# Patient Record
Sex: Female | Born: 1965 | Race: Black or African American | Hispanic: No | Marital: Single | Smoking: Never smoker
Health system: Southern US, Community
[De-identification: ages and names within clinical notes are randomized; demographics above are authoritative.]

## PROBLEM LIST (undated history)

## (undated) DIAGNOSIS — I10 Essential (primary) hypertension: Secondary | ICD-10-CM

## (undated) DIAGNOSIS — R7303 Prediabetes: Secondary | ICD-10-CM

## (undated) DIAGNOSIS — R011 Cardiac murmur, unspecified: Secondary | ICD-10-CM

## (undated) DIAGNOSIS — E785 Hyperlipidemia, unspecified: Secondary | ICD-10-CM

## (undated) HISTORY — DX: Prediabetes: R73.03

## (undated) HISTORY — DX: Essential (primary) hypertension: I10

## (undated) HISTORY — PX: WISDOM TOOTH EXTRACTION: SHX21

## (undated) HISTORY — PX: COLON SURGERY: SHX602

## (undated) HISTORY — DX: Hyperlipidemia, unspecified: E78.5

---

## 1998-03-17 ENCOUNTER — Encounter: Payer: Self-pay | Admitting: *Deleted

## 1998-03-17 ENCOUNTER — Ambulatory Visit (HOSPITAL_COMMUNITY): Admission: RE | Admit: 1998-03-17 | Discharge: 1998-03-17 | Payer: Self-pay | Admitting: *Deleted

## 1998-05-13 ENCOUNTER — Ambulatory Visit (HOSPITAL_COMMUNITY): Admission: RE | Admit: 1998-05-13 | Discharge: 1998-05-13 | Payer: Self-pay | Admitting: *Deleted

## 1998-05-13 ENCOUNTER — Encounter: Payer: Self-pay | Admitting: *Deleted

## 1999-07-06 ENCOUNTER — Encounter: Payer: Self-pay | Admitting: *Deleted

## 1999-07-06 ENCOUNTER — Ambulatory Visit (HOSPITAL_COMMUNITY): Admission: RE | Admit: 1999-07-06 | Discharge: 1999-07-06 | Payer: Self-pay | Admitting: *Deleted

## 2001-06-03 ENCOUNTER — Emergency Department (HOSPITAL_COMMUNITY): Admission: EM | Admit: 2001-06-03 | Discharge: 2001-06-03 | Payer: Self-pay | Admitting: Emergency Medicine

## 2003-02-04 ENCOUNTER — Emergency Department (HOSPITAL_COMMUNITY): Admission: EM | Admit: 2003-02-04 | Discharge: 2003-02-04 | Payer: Self-pay | Admitting: Family Medicine

## 2006-07-20 ENCOUNTER — Emergency Department (HOSPITAL_COMMUNITY): Admission: EM | Admit: 2006-07-20 | Discharge: 2006-07-20 | Payer: Self-pay | Admitting: Emergency Medicine

## 2007-11-26 ENCOUNTER — Emergency Department (HOSPITAL_COMMUNITY): Admission: EM | Admit: 2007-11-26 | Discharge: 2007-11-26 | Payer: Self-pay | Admitting: Family Medicine

## 2008-04-20 ENCOUNTER — Emergency Department (HOSPITAL_COMMUNITY): Admission: EM | Admit: 2008-04-20 | Discharge: 2008-04-20 | Payer: Self-pay | Admitting: Emergency Medicine

## 2008-05-28 ENCOUNTER — Emergency Department (HOSPITAL_COMMUNITY): Admission: EM | Admit: 2008-05-28 | Discharge: 2008-05-28 | Payer: Self-pay | Admitting: Family Medicine

## 2009-02-18 ENCOUNTER — Emergency Department (HOSPITAL_COMMUNITY): Admission: EM | Admit: 2009-02-18 | Discharge: 2009-02-18 | Payer: Self-pay | Admitting: Emergency Medicine

## 2011-01-21 ENCOUNTER — Emergency Department (HOSPITAL_COMMUNITY)
Admission: EM | Admit: 2011-01-21 | Discharge: 2011-01-21 | Disposition: A | Discharge status: Home / Self Care | Payer: PRIVATE HEALTH INSURANCE | Attending: Emergency Medicine | Admitting: Emergency Medicine

## 2011-01-21 ENCOUNTER — Encounter (HOSPITAL_COMMUNITY): Payer: Self-pay | Admitting: *Deleted

## 2011-01-21 ENCOUNTER — Emergency Department (HOSPITAL_COMMUNITY): Payer: PRIVATE HEALTH INSURANCE

## 2011-01-21 DIAGNOSIS — M25522 Pain in left elbow: Secondary | ICD-10-CM

## 2011-01-21 DIAGNOSIS — W010XXA Fall on same level from slipping, tripping and stumbling without subsequent striking against object, initial encounter: Secondary | ICD-10-CM | POA: Insufficient documentation

## 2011-01-21 DIAGNOSIS — M25529 Pain in unspecified elbow: Secondary | ICD-10-CM | POA: Insufficient documentation

## 2011-01-21 DIAGNOSIS — R011 Cardiac murmur, unspecified: Secondary | ICD-10-CM | POA: Insufficient documentation

## 2011-01-21 HISTORY — DX: Cardiac murmur, unspecified: R01.1

## 2011-01-21 MED ORDER — ACETAMINOPHEN 500 MG PO TABS
1000.0000 mg | ORAL_TABLET | Freq: Once | ORAL | Status: AC
  Administered 2011-01-21: 1000 mg via ORAL
  Filled 2011-01-21: qty 2

## 2011-01-21 NOTE — ED Provider Notes (Signed)
History     CSN: [id number]  Arrival date & time 01/21/11  7829   First MD Initiated Contact with Patient 01/21/11 [phone]      Chief Complaint  Patient presents with  . Fall  . Elbow Pain    (Consider location/radiation/quality/duration/timing/severity/associated sxs/prior treatment) HPI The patient presents 2 hours after a mechanical fall.  She slipped exiting her car, landing on her left arm.  Since the event.  She has had pain persistently about her left followup.  The pain is worse with motion,minimal improvement with ibuprofen.  Pain is described as dull.  No head contact, no LOC, no confusion, no ataxia, no other focal complaints Past Medical History  Diagnosis Date  . Heart murmur     Past Surgical History  Procedure Date  . Wisdom tooth extraction     Family History  Problem Relation Age of Onset  . Diabetes Mother   . Hypertension Mother   . Hypertension Father   . Cancer Father     History  Substance Use Topics  . Smoking status: Never Smoker   . Smokeless tobacco: Not on file  . Alcohol Use: No    OB History    Grav Para Term Preterm Abortions TAB SAB Ect Mult Living   1         1      Review of Systems  All other systems reviewed and are negative.    Allergies  Penicillins  Home Medications  No current outpatient prescriptions on file.  BP 145/84  Pulse 86  Temp(Src) 98.7 F (37.1 C) (Oral)  Resp 16  SpO2 99%  LMP 01/21/2011  Physical Exam  Nursing note and vitals reviewed. Constitutional: She is oriented to person, place, and time. She appears well-developed and well-nourished. No distress.  HENT:  Head: Normocephalic and atraumatic.  Mouth/Throat: Oropharynx is clear and moist.  Eyes: Conjunctivae are normal. Pupils are equal, round, and reactive to light. Right eye exhibits no discharge. Left eye exhibits no discharge.  Cardiovascular: Normal rate and intact distal pulses.   Murmur heard. Pulmonary/Chest: Effort normal and  breath sounds normal. No stridor. No respiratory distress.  Abdominal: She exhibits no distension.  Musculoskeletal:       Arms: Neurological: She is alert and oriented to person, place, and time. No cranial nerve deficit. She exhibits normal muscle tone. Coordination normal.  Skin: Skin is warm and dry. She is not diaphoretic.  Psychiatric: She has a normal mood and affect.    ED Course  Procedures (including critical care time)  Labs Reviewed - No data to display No results found.   No diagnosis found.   XR, reviewed by me - negative MDM  This generally well female presents following a mechanical fall with resultant left elbow pain.  On exam she is in no distress with minimal tenderness to palpation and no limitation to range of motion, nor any distal neurovascular deficits.  The patient's xrays were negative for fracture and she was discharged in stable condition.        Gerhard Munch, MD 01/21/11 [phone]

## 2011-01-21 NOTE — ED Notes (Signed)
md at bedside

## 2011-01-21 NOTE — ED Notes (Addendum)
Pt reports falling at work ([employer]) this am. Pt slipped and fell backwards landed on buttocks,back, and left elbow. Denies hitting head or loc. Full rom, can flex elbow with minor pain, can wiggle fingers, bilateral radial pulse +2. No obvious deformity noticed, slight small scratch on elbow. Pain 7/10 sharp continuous, radiates from elbow to shoulder.

## 2012-06-13 ENCOUNTER — Other Ambulatory Visit: Payer: Self-pay | Admitting: Family Medicine

## 2012-06-13 DIAGNOSIS — Z1231 Encounter for screening mammogram for malignant neoplasm of breast: Secondary | ICD-10-CM

## 2012-08-31 ENCOUNTER — Ambulatory Visit (HOSPITAL_BASED_OUTPATIENT_CLINIC_OR_DEPARTMENT_OTHER): Payer: 59 | Attending: Family Medicine

## 2012-08-31 VITALS — Ht 67.0 in | Wt 211.0 lb

## 2012-08-31 DIAGNOSIS — R0989 Other specified symptoms and signs involving the circulatory and respiratory systems: Secondary | ICD-10-CM | POA: Insufficient documentation

## 2012-08-31 DIAGNOSIS — G4733 Obstructive sleep apnea (adult) (pediatric): Secondary | ICD-10-CM

## 2012-08-31 DIAGNOSIS — G479 Sleep disorder, unspecified: Secondary | ICD-10-CM | POA: Insufficient documentation

## 2012-08-31 DIAGNOSIS — R0609 Other forms of dyspnea: Secondary | ICD-10-CM | POA: Insufficient documentation

## 2012-09-02 DIAGNOSIS — G4733 Obstructive sleep apnea (adult) (pediatric): Secondary | ICD-10-CM

## 2014-06-09 ENCOUNTER — Encounter (HOSPITAL_COMMUNITY): Payer: Self-pay | Admitting: Emergency Medicine

## 2014-06-09 ENCOUNTER — Emergency Department (HOSPITAL_COMMUNITY)
Admission: EM | Admit: 2014-06-09 | Discharge: 2014-06-09 | Disposition: A | Discharge status: Home / Self Care | Payer: Managed Care, Other (non HMO) | Source: Home / Self Care | Attending: Family Medicine | Admitting: Family Medicine

## 2014-06-09 DIAGNOSIS — J029 Acute pharyngitis, unspecified: Secondary | ICD-10-CM

## 2014-06-09 LAB — POCT RAPID STREP A: Streptococcus, Group A Screen (Direct): NEGATIVE

## 2014-06-09 MED ORDER — IPRATROPIUM BROMIDE 0.06 % NA SOLN: 2.0000 | Freq: Four times a day (QID) | NASAL | Status: DC

## 2014-06-09 NOTE — ED Provider Notes (Signed)
Gail Lloyd is a 49 y.o. female who presents to Urgent Care today for sore throat. Patient developed sore throat last night. This is associated with post nasal drip and fever. No vomiting diarrhea cough congestion or trouble breathing. She's used Tylenol which helps a lot.   Past Medical History  Diagnosis Date  . Heart murmur    Past Surgical History  Procedure Laterality Date  . Wisdom tooth extraction     History  Substance Use Topics  . Smoking status: Never Smoker   . Smokeless tobacco: Not on file  . Alcohol Use: No   ROS as above Medications: No current facility-administered medications for this encounter.   Current Outpatient Prescriptions  Medication Sig Dispense Refill  . acetaminophen (TYLENOL) 325 MG tablet Take 325 mg by mouth every 6 (six) hours as needed. For pain    . ibuprofen (ADVIL,MOTRIN) 200 MG tablet Take 200 mg by mouth every 6 (six) hours as needed. For pain    . ipratropium (ATROVENT) 0.06 % nasal spray Place 2 sprays into both nostrils 4 (four) times daily. 15 mL 1  . Multiple Vitamin (MULITIVITAMIN WITH MINERALS) TABS Take 1 tablet by mouth daily.     Allergies  Allergen Reactions  . Penicillins Hives and Itching  . Sudafed [Pseudoephedrine Hcl] Palpitations     Exam:  BP 143/70 mmHg  Pulse 120  Temp(Src) 100 F (37.8 C) (Oral)  Resp 18  SpO2 98%  LMP 06/08/2014 Gen: Well NAD HEENT: EOMI,  MMM clear nasal discharge. Posterior pharynx with cobblestoning. Normal tympanic membranes bilaterally. Lungs: Normal work of breathing. CTABL Heart: RRR no MRG Abd: NABS, Soft. Nondistended, Nontender Exts: Brisk capillary refill, warm and well perfused.   Results for orders placed or performed during the hospital encounter of 06/09/14 (from the past 24 hour(s))  POCT rapid strep A [facility] : [facility])     Status: None   Collection Time: 06/09/14  2:01 PM  Result Value Ref Range   Streptococcus, Group A Screen (Direct) NEGATIVE NEGATIVE   No results  found.  Assessment and Plan: 49 y.o. female with viral pharyngitis. Treatment with Tylenol and Atrovent nasal spray. Culture pending.  Discussed warning signs or symptoms. Please see discharge instructions. Patient expresses understanding.     Gregor Hams, MD 06/09/14 [phone]

## 2014-06-09 NOTE — ED Notes (Signed)
C/o ST associated w/PND and fevers since last night Alert, no signs of acute distress.

## 2014-06-09 NOTE — Discharge Instructions (Signed)
Thank you for coming in today. Call or go to the emergency room if you get worse, have trouble breathing, have chest pains, or palpitations.  Continue tylenol.  Use atrovent nasal spray.  Return as needed.   Pharyngitis Pharyngitis is redness, pain, and swelling (inflammation) of your pharynx.  CAUSES  Pharyngitis is usually caused by infection. Most of the time, these infections are from viruses (viral) and are part of a cold. However, sometimes pharyngitis is caused by bacteria (bacterial). Pharyngitis can also be caused by allergies. Viral pharyngitis may be spread from person to person by coughing, sneezing, and personal items or utensils (cups, forks, spoons, toothbrushes). Bacterial pharyngitis may be spread from person to person by more intimate contact, such as kissing.  SIGNS AND SYMPTOMS  Symptoms of pharyngitis include:   Sore throat.   Tiredness (fatigue).   Low-grade fever.   Headache.  Joint pain and muscle aches.  Skin rashes.  Swollen lymph nodes.  Plaque-like film on throat or tonsils (often seen with bacterial pharyngitis). DIAGNOSIS  Your health care provider will ask you questions about your illness and your symptoms. Your medical history, along with a physical exam, is often all that is needed to diagnose pharyngitis. Sometimes, a rapid strep test is done. Other lab tests may also be done, depending on the suspected cause.  TREATMENT  Viral pharyngitis will usually get better in 3-4 days without the use of medicine. Bacterial pharyngitis is treated with medicines that kill germs (antibiotics).  HOME CARE INSTRUCTIONS   Drink enough water and fluids to keep your urine clear or pale yellow.   Only take over-the-counter or prescription medicines as directed by your health care provider:   If you are prescribed antibiotics, make sure you finish them even if you start to feel better.   Do not take aspirin.   Get lots of rest.   Gargle with 8 oz of  salt water ( tsp of salt per 1 qt of water) as often as every 1-2 hours to soothe your throat.   Throat lozenges (if you are not at risk for choking) or sprays may be used to soothe your throat. SEEK MEDICAL CARE IF:   You have large, tender lumps in your neck.  You have a rash.  You cough up green, yellow-brown, or bloody spit. SEEK IMMEDIATE MEDICAL CARE IF:   Your neck becomes stiff.  You drool or are unable to swallow liquids.  You vomit or are unable to keep medicines or liquids down.  You have severe pain that does not go away with the use of recommended medicines.  You have trouble breathing (not caused by a stuffy nose). MAKE SURE YOU:   Understand these instructions.  Will watch your condition.  Will get help right away if you are not doing well or get worse. Document Released: 12/20/2004 Document Revised: 10/10/2012 Document Reviewed: 08/27/2012 [facility] Patient Information 2015 [place], [place]. This information is not intended to replace advice given to you by your health care provider. Make sure you discuss any questions you have with your health care provider.

## 2014-06-11 LAB — CULTURE, GROUP A STREP: Strep A Culture: NEGATIVE

## 2014-10-26 ENCOUNTER — Encounter (HOSPITAL_COMMUNITY): Payer: Self-pay

## 2014-10-26 ENCOUNTER — Emergency Department (HOSPITAL_COMMUNITY)
Admission: EM | Admit: 2014-10-26 | Discharge: 2014-10-26 | Disposition: A | Discharge status: Home / Self Care | Payer: Managed Care, Other (non HMO) | Attending: Emergency Medicine | Admitting: Emergency Medicine

## 2014-10-26 DIAGNOSIS — J302 Other seasonal allergic rhinitis: Secondary | ICD-10-CM | POA: Diagnosis not present

## 2014-10-26 DIAGNOSIS — Z880 Allergy status to penicillin: Secondary | ICD-10-CM | POA: Insufficient documentation

## 2014-10-26 DIAGNOSIS — R011 Cardiac murmur, unspecified: Secondary | ICD-10-CM | POA: Diagnosis not present

## 2014-10-26 DIAGNOSIS — J029 Acute pharyngitis, unspecified: Secondary | ICD-10-CM | POA: Diagnosis present

## 2014-10-26 DIAGNOSIS — Z9109 Other allergy status, other than to drugs and biological substances: Secondary | ICD-10-CM

## 2014-10-26 DIAGNOSIS — R591 Generalized enlarged lymph nodes: Secondary | ICD-10-CM

## 2014-10-26 LAB — RAPID STREP SCREEN (MED CTR MEBANE ONLY): STREPTOCOCCUS, GROUP A SCREEN (DIRECT): NEGATIVE

## 2014-10-26 MED ORDER — CETIRIZINE HCL 10 MG PO TABS: 10.0000 mg | ORAL_TABLET | Freq: Every day | ORAL | Status: DC

## 2014-10-26 NOTE — ED Provider Notes (Signed)
CSN: [id number]     Arrival date & time 10/26/14  2536 History   First MD Initiated Contact with Patient 10/26/14 [phone]     Chief Complaint  Patient presents with  . Sore Throat  . nasal drainage      (Consider location/radiation/quality/duration/timing/severity/associated sxs/prior Treatment) The history is provided by the patient and medical records. No language interpreter was used.     Gail Lloyd is a 49 y.o. female  with no major medical problems presents to the Emergency Department complaining of gradual, persistent, progressively worsening nasal drainage with associated sore throat onset 6 days ago after being outside all day. Patient reports associated postnasal drip, rhinorrhea, nasal congestion, itching and watering eyes, sneezing. She reports a long history of environmental allergies. She has taken Benadryl this week with improvement. She reports noting some "swelling" of her proximal neck 2 days ago which made her concerned. She denies difficulty swallowing or breathing. She denies dental pain or history of dental abscesses.  Nothing makes her symptoms worse.  Pt denies fever, chills, headache, neck pain, chest pain, shortness of breath, abdominal pain, nausea, vomiting, diarrhea, weakness, dizziness, syncope.  '  Past Medical History  Diagnosis Date  . Heart murmur    Past Surgical History  Procedure Laterality Date  . Wisdom tooth extraction     Family History  Problem Relation Age of Onset  . Diabetes Mother   . Hypertension Mother   . Hypertension Father   . Cancer Father    Social History  Substance Use Topics  . Smoking status: Never Smoker   . Smokeless tobacco: None  . Alcohol Use: No   OB History    Gravida Para Term Preterm AB TAB SAB Ectopic Multiple Living   1         1     Review of Systems  Constitutional: Negative for fever, diaphoresis, appetite change, fatigue and unexpected weight change.  HENT: Positive for congestion, ear pain (  resolved), facial swelling, postnasal drip, rhinorrhea, sneezing and sore throat. Negative for mouth sores.   Eyes: Positive for itching. Negative for visual disturbance.  Respiratory: Negative for cough, chest tightness, shortness of breath and wheezing.   Cardiovascular: Negative for chest pain.  Gastrointestinal: Negative for nausea, vomiting, abdominal pain, diarrhea and constipation.  Endocrine: Negative for polydipsia, polyphagia and polyuria.  Genitourinary: Negative for dysuria, urgency, frequency and hematuria.  Musculoskeletal: Negative for back pain and neck stiffness.  Skin: Negative for rash.  Allergic/Immunologic: Negative for immunocompromised state.  Neurological: Negative for syncope, light-headedness and headaches.  Hematological: Does not bruise/bleed easily.  Psychiatric/Behavioral: Negative for sleep disturbance. The patient is not nervous/anxious.       Allergies  Penicillins and Sudafed  Home Medications   Prior to Admission medications   Medication Sig Start Date End Date Taking? Authorizing Provider  diphenhydrAMINE (BENADRYL) 25 MG tablet Take 25 mg by mouth every 6 (six) hours as needed for allergies.   Yes Historical Provider, MD  cetirizine (ZYRTEC ALLERGY) 10 MG tablet Take 1 tablet (10 mg total) by mouth daily. 10/26/14   Nimisha Rathel, PA-C   BP 150/82 mmHg  Pulse 104  Temp(Src) 98.6 F (37 C) (Oral)  Resp 18  Ht 5\' 7"  (1.702 m)  Wt 207 lb (93.895 kg)  BMI 32.41 kg/m2  SpO2 100%  LMP 10/21/2014 (Exact Date) Physical Exam  Constitutional: She is oriented to person, place, and time. She appears well-developed and well-nourished. No distress.  HENT:  Head: Normocephalic  and atraumatic.  Right Ear: Tympanic membrane, external ear and ear canal normal.  Left Ear: Tympanic membrane, external ear and ear canal normal.  Nose: Mucosal edema and rhinorrhea present. No epistaxis. Right sinus exhibits no maxillary sinus tenderness and no frontal  sinus tenderness. Left sinus exhibits no maxillary sinus tenderness and no frontal sinus tenderness.  Mouth/Throat: Uvula is midline and mucous membranes are normal. Mucous membranes are not pale and not cyanotic. Posterior oropharyngeal erythema (minimal) present. No oropharyngeal exudate, posterior oropharyngeal edema or tonsillar abscesses.  No abnormal dentition Floor of the mouth is soft and nontender without woody induration Soft tissues of the mandible are pliable and nontender  Eyes: Conjunctivae are normal. Pupils are equal, round, and reactive to light.  Neck: Normal range of motion and full passive range of motion without pain.  Cardiovascular: Normal rate and intact distal pulses.   Pulmonary/Chest: Effort normal and breath sounds normal. No stridor.  Clear and equal breath sounds without focal wheezes, rhonchi, rales  Abdominal: Soft. Bowel sounds are normal. There is no tenderness.  Musculoskeletal: Normal range of motion.  Lymphadenopathy:       Head (right side): Submandibular and tonsillar adenopathy present.       Head (left side): Submandibular and tonsillar adenopathy present.    She has cervical adenopathy.       Right cervical: Superficial cervical adenopathy present.       Left cervical: Superficial cervical adenopathy present.  Lymphadenopathy is discrete, mobile and slightly tender bilaterally No matted lymph nodes  Neurological: She is alert and oriented to person, place, and time.  Skin: Skin is warm and dry. No rash noted. She is not diaphoretic.  Psychiatric: She has a normal mood and affect.  Nursing note and vitals reviewed.   ED Course  Procedures (including critical care time) Labs Review Labs Reviewed  RAPID STREP SCREEN (NOT AT [facility])    Imaging Review No results found. I have personally reviewed and evaluated these images and lab results as part of my medical decision-making.   EKG Interpretation None      MDM   Final diagnoses:   Environmental allergies  Lymphadenopathy of head and neck   Vickie Epley presents with history of environmental allergies and history and physical consistent with same today. Mild lymphadenopathy of the tonsils and submandibular region.  No evidence of Ludwig angina.  Lymphadenopathy is likely reactive. Will treat with symptomatically or PE and have her follow-up with her primary care physician in 2 days for recheck of her lymphadenopathy.  BP 150/82 mmHg  Pulse 104  Temp(Src) 98.6 F (37 C) (Oral)  Resp 18  Ht 5\' 7"  (1.702 m)  Wt 207 lb (93.895 kg)  BMI 32.41 kg/m2  SpO2 100%  LMP 10/21/2014 (Exact Date)    Abigail Butts, PA-C 10/26/14 0165  Everlene Balls, MD 10/26/14 [phone]

## 2014-10-26 NOTE — Discharge Instructions (Signed)
1. Medications: Zyrtec, flonase (at home), usual home medications 2. Treatment: rest, drink plenty of fluids, take medications as prescribed 3. Follow Up: Please followup with your primary doctor in 3 days for discussion of your diagnoses and further evaluation after today's visit; if you do not have a primary care doctor use the resource guide provided to find one; Return to the ER for difficulty breathing, difficulty swallowing, worsening pain, high fevers or other concerning symptoms    Hay Fever Hay fever is an allergic reaction to particles in the air. It cannot be passed from person to person. It cannot be cured, but it can be controlled. CAUSES  Hay fever is caused by something that triggers an allergic reaction (allergens). The following are examples of allergens:  Ragweed.  Feathers.  Animal dander.  Grass and tree pollens.  Cigarette smoke.  House dust.  Pollution. SYMPTOMS   Sneezing.  Runny or stuffy nose.  Tearing eyes.  Itchy eyes, nose, mouth, throat, skin, or other area.  Sore throat.  Headache.  Decreased sense of smell or taste. DIAGNOSIS Your caregiver will perform a physical exam and ask questions about the symptoms you are having.Allergy testing may be done to determine exactly what triggers your hay fever.  TREATMENT   Over-the-counter medicines may help symptoms. These include:  Antihistamines.  Decongestants. These may help with nasal congestion.  Your caregiver may prescribe medicines if over-the-counter medicines do not work.  Some people benefit from allergy shots when other medicines are not helpful. HOME CARE INSTRUCTIONS   Avoid the allergen that is causing your symptoms, if possible.  Take all medicine as told by your caregiver. SEEK MEDICAL CARE IF:   You have severe allergy symptoms and your current medicines are not helping.  Your treatment was working at one time, but you are now experiencing symptoms.  You have sinus  congestion and pressure.  You develop a fever or headache.  You have thick nasal discharge.  You have asthma and have a worsening cough and wheezing. SEEK IMMEDIATE MEDICAL CARE IF:   You have swelling of your tongue or lips.  You have trouble breathing.  You feel lightheaded or like you are going to faint.  You have cold sweats.  You have a fever.   This information is not intended to replace advice given to you by your health care provider. Make sure you discuss any questions you have with your health care provider.   Document Released: 12/20/2004 Document Revised: 03/14/2011 Document Reviewed: 07/02/2014 Elsevier Interactive Patient Education [employer].

## 2014-10-26 NOTE — ED Notes (Signed)
Pt states that she began having nasal drainage and sore throat. Airway in tact. Pt denies cough.

## 2014-10-28 LAB — CULTURE, GROUP A STREP: STREP A CULTURE: NEGATIVE

## 2016-01-05 ENCOUNTER — Ambulatory Visit (INDEPENDENT_AMBULATORY_CARE_PROVIDER_SITE_OTHER): Payer: Managed Care, Other (non HMO) | Admitting: Physician Assistant

## 2016-01-05 ENCOUNTER — Encounter: Payer: Self-pay | Admitting: Physician Assistant

## 2016-01-05 VITALS — BP 140/84 | HR 105 | Temp 99.1°F | Ht 67.0 in | Wt 205.2 lb

## 2016-01-05 DIAGNOSIS — J0140 Acute pansinusitis, unspecified: Secondary | ICD-10-CM

## 2016-01-05 MED ORDER — GUAIFENESIN ER 1200 MG PO TB12: 1.0000 | ORAL_TABLET | Freq: Two times a day (BID) | ORAL | 1 refills | Status: DC | PRN

## 2016-01-05 MED ORDER — DOXYCYCLINE HYCLATE 100 MG PO CAPS: 100.0000 mg | ORAL_CAPSULE | Freq: Two times a day (BID) | ORAL | 0 refills | Status: AC

## 2016-01-05 MED FILL — DOXYCYCLINE HYC 100 MG CAP: 100 | 7 days supply | Qty: 14 | Fill #0

## 2016-01-05 NOTE — Progress Notes (Signed)
[facility] [address], [place] [address] [phone]  Date:  01/05/2016   Name:  Gail Lloyd   DOB:  1965-04-25   MRN:  [id number]  PCP:  Gail Blackbird, MD    History of Present Illness:  Gail Lloyd is a 51 y.o. female patient who presents to [facility] for cc of sinus and congestion. Couple weeks ago of drainage.  2 days ago, woke up with cough, congestion, subjective fever and chills.  She rested.  Nighttime up more with cough.  Sinus pressure around eyes, and maxillary.  No rhinorrhea.  She is coughing it up with post nasal drip.  She feels congestion.  No sob or dyspnea.  ipratroprium nasal spray has helped. She is not taking the zyrtec.  She has been with sick contacts.   --hx of allergies   There are no active problems to display for this patient.   Past Medical History:  Diagnosis Date  . Heart murmur     Past Surgical History:  Procedure Laterality Date  . WISDOM TOOTH EXTRACTION      Social History  Substance Use Topics  . Smoking status: Never Smoker  . Smokeless tobacco: Never Used  . Alcohol use No    Family History  Problem Relation Age of Onset  . Diabetes Mother   . Hypertension Mother   . Hypertension Father   . Cancer Father     Allergies  Allergen Reactions  . Penicillins Hives and Itching  . Sudafed [Pseudoephedrine Hcl] Palpitations    Medication list has been reviewed and updated.  Current Outpatient Prescriptions on File Prior to Visit  Medication Sig Dispense Refill  . diphenhydrAMINE (BENADRYL) 25 MG tablet Take 25 mg by mouth every 6 (six) hours as needed for allergies.    . cetirizine (ZYRTEC ALLERGY) 10 MG tablet Take 1 tablet (10 mg total) by mouth daily. (Patient not taking: Reported on 01/05/2016) 30 tablet 1  . [DISCONTINUED] ipratropium (ATROVENT) 0.06 % nasal spray Place 2 sprays into both nostrils 4 (four) times daily. (Patient not taking: Reported on 10/26/2014) 15 mL 1   No current  facility-administered medications on file prior to visit.     ROS ROS otherwise unremarkable unless listed above.   Physical Examination: BP 140/84 (BP Location: Right Arm, Patient Position: Sitting, Cuff Size: Small)   Pulse (!) 105   Temp 99.1 F (37.3 C) (Oral)   Ht 5\' 7"  (1.702 m)   Wt 205 lb 3.2 oz (93.1 kg)   LMP 12/26/2015 (Exact Date)   SpO2 98%   BMI 32.14 kg/m  Ideal Body Weight: Weight in (lb) to have BMI = 25: 159.3  Physical Exam  Constitutional: She is oriented to person, place, and time. She appears well-developed and well-nourished. No distress.  HENT:  Head: Normocephalic and atraumatic.  Right Ear: Tympanic membrane, external ear and ear canal normal.  Left Ear: Tympanic membrane, external ear and ear canal normal.  Nose: Mucosal edema and rhinorrhea present. Right sinus exhibits no maxillary sinus tenderness and no frontal sinus tenderness. Left sinus exhibits no maxillary sinus tenderness and no frontal sinus tenderness.  Mouth/Throat: No uvula swelling. No oropharyngeal exudate, posterior oropharyngeal edema or posterior oropharyngeal erythema.  Eyes: Conjunctivae and EOM are normal. Pupils are equal, round, and reactive to light.  Cardiovascular: Normal rate and regular rhythm.  Exam reveals no gallop, no distant heart sounds and no friction rub.   No murmur heard. Pulmonary/Chest: Effort normal.  No respiratory distress. She has no decreased breath sounds. She has no wheezes. She has no rhonchi.  Lymphadenopathy:       Head (right side): No submandibular, no tonsillar, no preauricular and no posterior auricular adenopathy present.       Head (left side): No submandibular, no tonsillar, no preauricular and no posterior auricular adenopathy present.  Neurological: She is alert and oriented to person, place, and time.  Skin: She is not diaphoretic.  Psychiatric: She has a normal mood and affect. Her behavior is normal.     Assessment and Plan: [name] is a 51 y.o. female who is here today for cc of sinus issues, and congestion. Will treat for possible double sickening with bacterial etiology.    Subacute pansinusitis - Plan: doxycycline (VIBRAMYCIN) 100 MG capsule  Gail Drape, PA-C [facility] 1/7/20185:33 PM

## 2016-01-05 NOTE — Patient Instructions (Addendum)
Sinusitis, Adult Sinusitis is soreness and inflammation of your sinuses. Sinuses are hollow spaces in the bones around your face. They are located:  Around your eyes.  In the middle of your forehead.  Behind your nose.  In your cheekbones. Your sinuses and nasal passages are lined with a stringy fluid (mucus). Mucus normally drains out of your sinuses. When your nasal tissues get inflamed or swollen, the mucus can get trapped or blocked so air cannot flow through your sinuses. This lets bacteria, viruses, and funguses grow, and that leads to infection. Follow these instructions at home: Medicines   Take, use, or apply over-the-counter and prescription medicines only as told by your doctor. These may include nasal sprays.  If you were prescribed an antibiotic medicine, take it as told by your doctor. Do not stop taking the antibiotic even if you start to feel better. Hydrate and Humidify   Drink enough water to keep your pee (urine) clear or pale yellow.  Use a cool mist humidifier to keep the humidity level in your home above 50%.  Breathe in steam for 10-15 minutes, 3-4 times a day or as told by your doctor. You can do this in the bathroom while a hot shower is running.  Try not to spend time in cool or dry air. Rest   Rest as much as possible.  Sleep with your head raised (elevated).  Make sure to get enough sleep each night. General instructions   Put a warm, moist washcloth on your face 3-4 times a day or as told by your doctor. This will help with discomfort.  Wash your hands often with soap and water. If there is no soap and water, use hand sanitizer.  Do not smoke. Avoid being around people who are smoking (secondhand smoke).  Keep all follow-up visits as told by your doctor. This is important. Contact a doctor if:  You have a fever.  Your symptoms get worse.  Your symptoms do not get better within 10 days. Get help right away if:  You have a very bad  headache.  You cannot stop throwing up (vomiting).  You have pain or swelling around your face or eyes.  You have trouble seeing.  You feel confused.  Your neck is stiff.  You have trouble breathing. This information is not intended to replace advice given to you by your health care provider. Make sure you discuss any questions you have with your health care provider. Document Released: 06/08/2007 Document Revised: 08/16/2015 Document Reviewed: 10/15/2014 Elsevier Interactive Patient Education  2017 Elsevier Inc.     IF you received an x-ray today, you will receive an invoice from [facility]. Please contact [facility] at [phone] with questions or concerns regarding your invoice.   IF you received labwork today, you will receive an invoice from [facility]. Please contact [facility] at [phone] with questions or concerns regarding your invoice.   Our billing staff will not be able to assist you with questions regarding bills from these companies.  You will be contacted with the lab results as soon as they are available. The fastest way to get your results is to activate your My Chart account. Instructions are located on the last page of this paperwork. If you have not heard from us regarding the results in 2 weeks, please contact this office.     

## 2016-01-07 ENCOUNTER — Other Ambulatory Visit: Payer: Self-pay | Admitting: Physician Assistant

## 2016-01-07 ENCOUNTER — Telehealth: Payer: Self-pay

## 2016-01-07 MED ORDER — HYDROCOD POLST-CPM POLST ER 10-8 MG/5ML PO SUER: 5.0000 mL | Freq: Every evening | ORAL | 0 refills | Status: DC | PRN

## 2016-01-07 NOTE — Telephone Encounter (Signed)
Would you rx a cough suppresant for this patient so she can sleep?

## 2016-01-07 NOTE — Telephone Encounter (Signed)
Patient advised up front for pick up

## 2016-01-07 NOTE — Telephone Encounter (Signed)
Prescription filled.  She will need to pick it up.

## 2016-01-07 NOTE — Telephone Encounter (Signed)
Pt was seen on 01/05/15 for Subacute pansinusitis by Charlsie Quest English. She was prescribed Guaifenesin (MUCINEX MAXIMUM STRENGTH) 1200 MG TB12. She feels this script is not working, she is still coughing and is not getting any sleep. She would like to be prescribed something else. Pharmacy:  [place], [place] - [address] [facility].. Please advise at [phone]

## 2017-02-23 ENCOUNTER — Ambulatory Visit (HOSPITAL_COMMUNITY)
Admission: EM | Admit: 2017-02-23 | Discharge: 2017-02-23 | Disposition: A | Discharge status: Home / Self Care | Payer: 59 | Attending: Family Medicine | Admitting: Family Medicine

## 2017-02-23 ENCOUNTER — Encounter (HOSPITAL_COMMUNITY): Payer: Self-pay | Admitting: Emergency Medicine

## 2017-02-23 ENCOUNTER — Ambulatory Visit (INDEPENDENT_AMBULATORY_CARE_PROVIDER_SITE_OTHER): Payer: 59

## 2017-02-23 DIAGNOSIS — J0140 Acute pansinusitis, unspecified: Secondary | ICD-10-CM

## 2017-02-23 DIAGNOSIS — R05 Cough: Secondary | ICD-10-CM | POA: Diagnosis not present

## 2017-02-23 DIAGNOSIS — R0602 Shortness of breath: Secondary | ICD-10-CM

## 2017-02-23 MED ORDER — FLUTICASONE PROPIONATE 50 MCG/ACT NA SUSP: 2.0000 | Freq: Every day | NASAL | 0 refills | Status: DC

## 2017-02-23 MED ORDER — IPRATROPIUM BROMIDE 0.06 % NA SOLN: 2.0000 | Freq: Four times a day (QID) | NASAL | 0 refills | Status: DC

## 2017-02-23 MED ORDER — DOXYCYCLINE HYCLATE 100 MG PO CAPS: 100.0000 mg | ORAL_CAPSULE | Freq: Two times a day (BID) | ORAL | 0 refills | Status: DC

## 2017-02-23 NOTE — ED Triage Notes (Signed)
PT reports cough, sinus drainage for over 1 week. PT has been using claritin without relief.

## 2017-02-23 NOTE — ED Provider Notes (Signed)
[facility]    CSN: [id number] Arrival date & time: 02/23/17  1055     History   Chief Complaint Chief Complaint  Patient presents with  . URI    HPI Gail Lloyd is a 52 y.o. female.   52 year old female comes in for 1-2-week history of URI symptoms. Productive cough, sinus drainage, rhinorrhea, throat irritation. Now with back and chest soreness during cough. Denies fever, chills, night sweats. States some shortness of breath during cough with some wheezing. Has tried Claritin without relief. Never smoker.        Past Medical History:  Diagnosis Date  . Heart murmur     There are no active problems to display for this patient.   Past Surgical History:  Procedure Laterality Date  . WISDOM TOOTH EXTRACTION      OB History    Gravida Para Term Preterm AB Living   1         1   SAB TAB Ectopic Multiple Live Births                   Home Medications    Prior to Admission medications   Medication Sig Start Date End Date Taking? Authorizing Provider  diphenhydrAMINE (BENADRYL) 25 MG tablet Take 25 mg by mouth every 6 (six) hours as needed for allergies.   Yes [provider]  chlorpheniramine-HYDROcodone (TUSSIONEX PENNKINETIC ER) 10-8 MG/5ML SUER Take 5 mLs by mouth at bedtime as needed. 01/07/16   Ivar Drape D, PA  doxycycline (VIBRAMYCIN) 100 MG capsule Take 1 capsule (100 mg total) by mouth 2 (two) times daily. 02/23/17   Tasia Catchings, Cecilie Heidel V, PA-C  fluticasone (FLONASE) 50 MCG/ACT nasal spray Place 2 sprays into both nostrils daily. 02/23/17   Tasia Catchings, Yareth Kearse V, PA-C  Guaifenesin (MUCINEX MAXIMUM STRENGTH) 1200 MG TB12 Take 1 tablet (1,200 mg total) by mouth every 12 (twelve) hours as needed. 01/05/16   Ivar Drape D, PA  ipratropium (ATROVENT) 0.06 % nasal spray Place 2 sprays into both nostrils 4 (four) times daily. 02/23/17   Ok Edwards, PA-C    Family History Family History  Problem Relation Age of Onset  . Diabetes Mother   . Hypertension  Mother   . Hypertension Father   . Cancer Father     Social History Social History   Tobacco Use  . Smoking status: Never Smoker  . Smokeless tobacco: Never Used  Substance Use Topics  . Alcohol use: No  . Drug use: No     Allergies   Penicillins and Sudafed [pseudoephedrine hcl]   Review of Systems Review of Systems  Reason unable to perform ROS: See HPI as above.     Physical Exam Triage Vital Signs ED Triage Vitals  Enc Vitals Group     BP 02/23/17 1109 (!) 150/88     Pulse Rate 02/23/17 1109 94     Resp 02/23/17 1109 16     Temp 02/23/17 1109 99.7 F (37.6 C)     Temp Source 02/23/17 1109 Oral     SpO2 02/23/17 1109 99 %     Weight 02/23/17 1108 207 lb (93.9 kg)     Height --      Head Circumference --      Peak Flow --      Pain Score 02/23/17 1108 0     Pain Loc --      Pain Edu? --      Excl. in [place]? --  No data found.  Updated Vital Signs BP (!) 150/88   Pulse 94   Temp 99.7 F (37.6 C) (Oral)   Resp 16   Wt 207 lb (93.9 kg)   LMP 01/31/2017   SpO2 99%   BMI 32.42 kg/m   Physical Exam  Constitutional: She is oriented to person, place, and time. She appears well-developed and well-nourished. No distress.  HENT:  Head: Normocephalic and atraumatic.  Right Ear: Tympanic membrane, external ear and ear canal normal. Tympanic membrane is not erythematous and not bulging.  Left Ear: Tympanic membrane, external ear and ear canal normal. Tympanic membrane is not erythematous and not bulging.  Nose: Mucosal edema and rhinorrhea present. Right sinus exhibits maxillary sinus tenderness and frontal sinus tenderness. Left sinus exhibits maxillary sinus tenderness and frontal sinus tenderness.  Mouth/Throat: Uvula is midline, oropharynx is clear and moist and mucous membranes are normal.  Eyes: Conjunctivae are normal. Pupils are equal, round, and reactive to light.  Neck: Normal range of motion. Neck supple.  Cardiovascular: Normal rate, regular  rhythm and normal heart sounds. Exam reveals no gallop and no friction rub.  No murmur heard. Pulmonary/Chest: Effort normal and breath sounds normal. She has no decreased breath sounds. She has no wheezes. She has no rhonchi. She has no rales.  Lymphadenopathy:    She has no cervical adenopathy.  Neurological: She is alert and oriented to person, place, and time.  Skin: Skin is warm and dry.  Psychiatric: She has a normal mood and affect. Her behavior is normal. Judgment normal.     UC Treatments / Results  Labs (all labs ordered are listed, but only abnormal results are displayed) Labs Reviewed - No data to display  EKG  EKG Interpretation None       Radiology Dg Chest 2 View  Result Date: 02/23/2017 CLINICAL DATA:  Productive cough, low-grade fever, shortness of Breath EXAM: CHEST  2 VIEW COMPARISON:  None FINDINGS: Heart and mediastinal contours are within normal limits. No focal opacities or effusions. No acute bony abnormality. IMPRESSION: No active cardiopulmonary disease. Electronically Signed   By: Rolm Baptise M.D.   On: 02/23/2017 12:07    Procedures Procedures (including critical care time)  Medications Ordered in UC Medications - No data to display   Initial Impression / Assessment and Plan / UC Course  I have reviewed the triage vital signs and the nursing notes.  Pertinent labs & imaging results that were available during my care of the patient were reviewed by me and considered in my medical decision making (see chart for details).    CXR without pneumonia. Will treat for sinusitis with doxycycline. Other symptomatic treatment discussed. Push fluids. Return precautions given.   Final Clinical Impressions(s) / UC Diagnoses   Final diagnoses:  Acute non-recurrent pansinusitis    ED Discharge Orders        Ordered    doxycycline (VIBRAMYCIN) 100 MG capsule  2 times daily     02/23/17 1218    fluticasone (FLONASE) 50 MCG/ACT nasal spray  Daily      02/23/17 1219    ipratropium (ATROVENT) 0.06 % nasal spray  4 times daily     02/23/17 1219        Ok Edwards, PA-C 02/23/17 1221

## 2017-02-23 NOTE — Discharge Instructions (Signed)
Chest xray negative for pneumonia. Start doxycycline for sinus infection. You can also use flonase, atrovent nasal spray to help with sinus pressure, nasal congestion/drainage. You can use over the counter nasal saline rinse such as neti pot for nasal congestion. Keep hydrated, your urine should be clear to pale yellow in color. Tylenol/motrin for fever and pain. Monitor for any worsening of symptoms, chest pain, shortness of breath, wheezing, swelling of the throat, follow up for reevaluation.   For sore throat try using a honey-based tea. Use 3 teaspoons of honey with juice squeezed from half lemon. Place shaved pieces of ginger into 1/2-1 cup of water and warm over stove top. Then mix the ingredients and repeat every 4 hours as needed.

## 2017-05-09 ENCOUNTER — Other Ambulatory Visit: Payer: Self-pay

## 2017-05-09 DIAGNOSIS — Z1231 Encounter for screening mammogram for malignant neoplasm of breast: Secondary | ICD-10-CM

## 2018-08-02 ENCOUNTER — Telehealth: Payer: Self-pay | Admitting: Family Medicine

## 2018-08-02 NOTE — Telephone Encounter (Signed)
Patient is calling to schedule a new patient appointment with Dr. Volanda Napoleon. However is also interested in getting COVID testing. Patient is interested in getting tested tomorrow if possible. Please advise CB- [phone]

## 2018-08-03 NOTE — Telephone Encounter (Signed)
Pt scheduled for NP appt with [facility]. Advised if she gets COVID testing done and is positive we need to know and her appt will need to be rescheduled. Pt voiced understanding.

## 2018-08-09 ENCOUNTER — Ambulatory Visit (INDEPENDENT_AMBULATORY_CARE_PROVIDER_SITE_OTHER): Payer: 59 | Admitting: Family Medicine

## 2018-08-09 DIAGNOSIS — R030 Elevated blood-pressure reading, without diagnosis of hypertension: Secondary | ICD-10-CM

## 2018-08-09 DIAGNOSIS — U071 COVID-19: Secondary | ICD-10-CM | POA: Diagnosis not present

## 2018-08-09 NOTE — Progress Notes (Signed)
Virtual Visit via Video Note  I connected with Gail Lloyd on 08/09/18 at  3:00 PM EDT by a video enabled telemedicine application 2/2 [id number] pandemic and verified that I am speaking with the correct person using two identifiers.  Location patient: home Location provider:work or home office Persons participating in the virtual visit: patient, provider  I discussed the limitations of evaluation and management by telemedicine and the availability of in person appointments. The patient expressed understanding and agreed to proceed.   HPI:  Pt is a 53 yo female with pmh sig for heart murmur.  Pt previously seen by Dr. Antony Lloyd.  COVID positive: -felt flush last Tuesday at work, temp was normal -Got tested Friday at [employer], given positive result on Sunday -VS at time of visit Friday 160/81, P 112, pO2 98%, temp 100.9 -Endorsed nasal drainage, low grade temp to Tmax 102, ear pressure -denies cough, SOB, sneezing, sore throat, -alternating Tylenol and advil. -feeling better. -unsure of sick contacts.  States her uncle started feeling sick 3 wks ago, but wasn't tested when she took him to the hospital. He has recently tested positive and is "sick".  Elevated BP: -no prior h/o HTN -not on meds -bp 160/81 during UC visit last wk. -pt denies changes in vision, CP  Social hx: Pt works at [employer].     ROS: See pertinent positives and negatives per HPI.  Past Medical History:  Diagnosis Date  . Heart murmur     Past Surgical History:  Procedure Laterality Date  . WISDOM TOOTH EXTRACTION      Family History  Problem Relation Age of Onset  . Diabetes Mother   . Hypertension Mother   . Hypertension Father   . Cancer Father      Current Outpatient Medications:  .  chlorpheniramine-HYDROcodone (TUSSIONEX PENNKINETIC ER) 10-8 MG/5ML SUER, Take 5 mLs by mouth at bedtime as needed., Disp: 60 mL, Rfl: 0 .  diphenhydrAMINE (BENADRYL) 25 MG tablet, Take 25 mg by mouth  every 6 (six) hours as needed for allergies., Disp: , Rfl:  .  doxycycline (VIBRAMYCIN) 100 MG capsule, Take 1 capsule (100 mg total) by mouth 2 (two) times daily., Disp: 20 capsule, Rfl: 0 .  fluticasone (FLONASE) 50 MCG/ACT nasal spray, Place 2 sprays into both nostrils daily., Disp: 1 g, Rfl: 0 .  Guaifenesin (MUCINEX MAXIMUM STRENGTH) 1200 MG TB12, Take 1 tablet (1,200 mg total) by mouth every 12 (twelve) hours as needed., Disp: 14 tablet, Rfl: 1 .  ipratropium (ATROVENT) 0.06 % nasal spray, Place 2 sprays into both nostrils 4 (four) times daily., Disp: 15 mL, Rfl: 0  EXAM:  VITALS per patient if applicable: Temp 64.3 F, RR between 12-20 bpm  GENERAL: alert, oriented, appears well and in no acute distress  HEENT: atraumatic, conjunctiva clear, no obvious abnormalities on inspection of external nose and ears  NECK: normal movements of the head and neck  LUNGS: on inspection no signs of respiratory distress, breathing rate appears normal, no obvious gross SOB, gasping or wheezing  CV: no obvious cyanosis  MS: moves all visible extremities without noticeable abnormality  PSYCH/NEURO: pleasant and cooperative, no obvious depression or anxiety, speech and thought processing grossly intact  ASSESSMENT AND PLAN:  Discussed the following assessment and plan:  COVID-19 virus infection  -supportive care -given precautions -continue self quarantine -if needed will provide note for work  Elevated blood-pressure reading without diagnosis of hypertension  -likely elevated 2/2 ongoing illness -discussed lifestyle modifications when  recovered from COVID infection -pt to monitor bp at home and keep a log -if bp continues to remain elevated after recovery from [employer] will discuss starting bp medication  F/u in 1-2 wks    I discussed the assessment and treatment plan with the patient. The patient was provided an opportunity to ask questions and all were answered. The patient agreed with the  plan and demonstrated an understanding of the instructions.   The patient was advised to call back or seek an in-person evaluation if the symptoms worsen or if the condition fails to improve as anticipated.  Billie Ruddy, MD

## 2018-08-10 ENCOUNTER — Encounter: Payer: Self-pay | Admitting: Family Medicine

## 2018-09-21 ENCOUNTER — Other Ambulatory Visit: Payer: Self-pay | Admitting: Family Medicine

## 2018-09-21 DIAGNOSIS — Z1231 Encounter for screening mammogram for malignant neoplasm of breast: Secondary | ICD-10-CM

## 2018-10-05 ENCOUNTER — Ambulatory Visit
Admission: RE | Admit: 2018-10-05 | Discharge: 2018-10-05 | Disposition: A | Discharge status: Home / Self Care | Payer: 59 | Source: Ambulatory Visit | Attending: Family Medicine | Admitting: Family Medicine

## 2018-10-05 ENCOUNTER — Other Ambulatory Visit: Payer: Self-pay

## 2018-10-05 DIAGNOSIS — Z1231 Encounter for screening mammogram for malignant neoplasm of breast: Secondary | ICD-10-CM

## 2018-10-08 LAB — HM MAMMOGRAPHY

## 2018-10-09 ENCOUNTER — Other Ambulatory Visit: Payer: Self-pay | Admitting: Family Medicine

## 2018-10-09 DIAGNOSIS — R928 Other abnormal and inconclusive findings on diagnostic imaging of breast: Secondary | ICD-10-CM

## 2018-10-10 ENCOUNTER — Encounter: Payer: Self-pay | Admitting: Family Medicine

## 2018-10-11 ENCOUNTER — Ambulatory Visit
Admission: RE | Admit: 2018-10-11 | Discharge: 2018-10-11 | Disposition: A | Discharge status: Home / Self Care | Payer: 59 | Source: Ambulatory Visit | Attending: Family Medicine | Admitting: Family Medicine

## 2018-10-11 ENCOUNTER — Other Ambulatory Visit: Payer: Self-pay | Admitting: Family Medicine

## 2018-10-11 ENCOUNTER — Other Ambulatory Visit: Payer: Self-pay

## 2018-10-11 DIAGNOSIS — R928 Other abnormal and inconclusive findings on diagnostic imaging of breast: Secondary | ICD-10-CM

## 2018-10-11 DIAGNOSIS — N63 Unspecified lump in unspecified breast: Secondary | ICD-10-CM

## 2018-10-11 LAB — HM MAMMOGRAPHY

## 2018-10-24 ENCOUNTER — Encounter: Payer: Self-pay | Admitting: Family Medicine

## 2018-11-06 ENCOUNTER — Ambulatory Visit: Payer: 59

## 2018-11-06 ENCOUNTER — Encounter: Payer: Self-pay | Admitting: Family Medicine

## 2019-01-30 ENCOUNTER — Telehealth (INDEPENDENT_AMBULATORY_CARE_PROVIDER_SITE_OTHER): Payer: Managed Care, Other (non HMO) | Admitting: Family Medicine

## 2019-01-30 DIAGNOSIS — I10 Essential (primary) hypertension: Secondary | ICD-10-CM

## 2019-01-30 MED ORDER — AMLODIPINE BESYLATE 5 MG PO TABS: 5.0000 mg | ORAL_TABLET | Freq: Every day | ORAL | 3 refills | Status: DC

## 2019-01-30 NOTE — Progress Notes (Signed)
This visit type was conducted due to national recommendations for restrictions regarding the COVID-19 pandemic in an effort to limit this patient's exposure and mitigate transmission in our community.   Virtual Visit via Video Note  I connected with Gail Lloyd on 01/30/19 at  5:15 PM EST by a video enabled telemedicine application and verified that I am speaking with the correct person using two identifiers.  Location patient: home Location provider:work or home office Persons participating in the virtual visit: patient, provider  I discussed the limitations of evaluation and management by telemedicine and the availability of in person appointments. The patient expressed understanding and agreed to proceed.   HPI: [facility] had called with concerns for elevated blood pressures.  She states that she has actually had some borderline elevations for past couple years.  She went to her dentist earlier today and had reading 180/108.  She generally feels fine.  She has not had any recent dizziness, chest pains, headaches.  She has been monitoring blood pressure at home and has had readings generally [id number] systolic and around 90 diastolic.  Very strong family history of hypertension and mother, brother, and sister.  No nonsteroidal use.  No alcohol use.  No regular exercise.  Tries to watch her sodium intake.  Never treated for hypertension previously.   ROS: See pertinent positives and negatives per HPI.  Past Medical History:  Diagnosis Date  . Heart murmur     Past Surgical History:  Procedure Laterality Date  . WISDOM TOOTH EXTRACTION      Family History  Problem Relation Age of Onset  . Diabetes Mother   . Hypertension Mother   . Hypertension Father   . Cancer Father     SOCIAL HX: Non-smoker and no alcohol use   Current Outpatient Medications:  .  amLODipine (NORVASC) 5 MG tablet, Take 1 tablet (5 mg total) by mouth daily., Disp: 90 tablet, Rfl: 3 .  chlorpheniramine-HYDROcodone  (TUSSIONEX PENNKINETIC ER) 10-8 MG/5ML SUER, Take 5 mLs by mouth at bedtime as needed., Disp: 60 mL, Rfl: 0 .  diphenhydrAMINE (BENADRYL) 25 MG tablet, Take 25 mg by mouth every 6 (six) hours as needed for allergies., Disp: , Rfl:  .  doxycycline (VIBRAMYCIN) 100 MG capsule, Take 1 capsule (100 mg total) by mouth 2 (two) times daily., Disp: 20 capsule, Rfl: 0 .  fluticasone (FLONASE) 50 MCG/ACT nasal spray, Place 2 sprays into both nostrils daily., Disp: 1 g, Rfl: 0 .  Guaifenesin (MUCINEX MAXIMUM STRENGTH) 1200 MG TB12, Take 1 tablet (1,200 mg total) by mouth every 12 (twelve) hours as needed., Disp: 14 tablet, Rfl: 1 .  ipratropium (ATROVENT) 0.06 % nasal spray, Place 2 sprays into both nostrils 4 (four) times daily., Disp: 15 mL, Rfl: 0  EXAM:  VITALS per patient if applicable:  GENERAL: alert, oriented, appears well and in no acute distress  HEENT: atraumatic, conjunttiva clear, no obvious abnormalities on inspection of external nose and ears  NECK: normal movements of the head and neck  LUNGS: on inspection no signs of respiratory distress, breathing rate appears normal, no obvious gross SOB, gasping or wheezing  CV: no obvious cyanosis  MS: moves all visible extremities without noticeable abnormality  PSYCH/NEURO: pleasant and cooperative, no obvious depression or anxiety, speech and thought processing grossly intact  ASSESSMENT AND PLAN:  Discussed the following assessment and plan:  Hypertension currently untreated.  She may have some element of whitecoat syndrome but she has had several home readings that have been  up as well recently.  Currently asymptomatic.  We recommended the following:  -Set up follow-up with her primary Dr. Volanda Napoleon .  She has not had a physical in some time and we suggested that she may consider setting up physical and getting some baseline labs  -Discussed nonpharmacologic management of elevated blood pressure with watching sodium intake, avoidance of  regular nonsteroidals, exercise, weight loss  -Start amlodipine 5 mg once daily given her multiple elevated readings recently and she is strongly encouraged to set up follow-up with Dr. Volanda Napoleon within the next few weeks to reassess     I discussed the assessment and treatment plan with the patient. The patient was provided an opportunity to ask questions and all were answered. The patient agreed with the plan and demonstrated an understanding of the instructions.   The patient was advised to call back or seek an in-person evaluation if the symptoms worsen or if the condition fails to improve as anticipated.    Carolann Littler, MD

## 2019-03-02 ENCOUNTER — Ambulatory Visit: Payer: 59 | Attending: Internal Medicine

## 2019-03-02 DIAGNOSIS — Z23 Encounter for immunization: Secondary | ICD-10-CM | POA: Insufficient documentation

## 2019-03-02 NOTE — Progress Notes (Signed)
   Covid-19 Vaccination Clinic  Name:  Gail Lloyd    MRN: [id number] DOB: 1965-07-07  03/02/2019  Gail Lloyd was observed post Covid-19 immunization for 15 minutes without incidence. She was provided with Vaccine Information Sheet and instruction to access the V-Safe system.   [facility] was instructed to call 911 with any severe reactions post vaccine: Marland Kitchen Difficulty breathing  . Swelling of your face and throat  . A fast heartbeat  . A bad rash all over your body  . Dizziness and weakness    Immunizations Administered    Name Date Dose VIS Date Route   Pfizer COVID-19 Vaccine 03/02/2019  6:48 PM 0.3 mL 12/14/2018 Intramuscular   Manufacturer: [place]   Lot: [id number]   [place]: [id number]

## 2019-03-06 ENCOUNTER — Ambulatory Visit: Payer: 59 | Admitting: Family Medicine

## 2019-03-23 ENCOUNTER — Ambulatory Visit: Payer: 59 | Attending: Internal Medicine

## 2019-03-23 DIAGNOSIS — Z23 Encounter for immunization: Secondary | ICD-10-CM

## 2019-03-23 NOTE — Progress Notes (Signed)
   Covid-19 Vaccination Clinic  Name:  Lyrissa Velasques    MRN: [id number] DOB: 11-May-1965  03/23/2019  Ms. Fandrich was observed post Covid-19 immunization for 15 minutes without incident. She was provided with Vaccine Information Sheet and instruction to access the V-Safe system.   Ms. Speake was instructed to call 911 with any severe reactions post vaccine: Marland Kitchen Difficulty breathing  . Swelling of face and throat  . A fast heartbeat  . A bad rash all over body  . Dizziness and weakness   Immunizations Administered    Name Date Dose VIS Date Route   Pfizer COVID-19 Vaccine 03/23/2019  4:51 PM 0.3 mL 12/14/2018 Intramuscular   Manufacturer: [place]   Lot: [id number]   [place]: [id number]

## 2019-04-12 ENCOUNTER — Other Ambulatory Visit: Payer: 59

## 2019-04-18 ENCOUNTER — Other Ambulatory Visit: Payer: Self-pay | Admitting: Family Medicine

## 2019-04-23 ENCOUNTER — Other Ambulatory Visit: Payer: Self-pay | Admitting: Family Medicine

## 2019-04-23 ENCOUNTER — Ambulatory Visit
Admission: RE | Admit: 2019-04-23 | Discharge: 2019-04-23 | Disposition: A | Discharge status: Home / Self Care | Payer: 59 | Source: Ambulatory Visit | Attending: Family Medicine | Admitting: Family Medicine

## 2019-04-23 ENCOUNTER — Other Ambulatory Visit: Payer: Self-pay

## 2019-04-23 DIAGNOSIS — N63 Unspecified lump in unspecified breast: Secondary | ICD-10-CM

## 2019-09-30 ENCOUNTER — Other Ambulatory Visit: Payer: Self-pay

## 2019-09-30 ENCOUNTER — Encounter (HOSPITAL_COMMUNITY): Payer: Self-pay

## 2019-09-30 ENCOUNTER — Emergency Department (HOSPITAL_COMMUNITY)
Admission: EM | Admit: 2019-09-30 | Discharge: 2019-10-01 | Disposition: A | Discharge status: Home / Self Care | Payer: 59 | Attending: Emergency Medicine | Admitting: Emergency Medicine

## 2019-09-30 DIAGNOSIS — R05 Cough: Secondary | ICD-10-CM | POA: Diagnosis not present

## 2019-09-30 DIAGNOSIS — Z5321 Procedure and treatment not carried out due to patient leaving prior to being seen by health care provider: Secondary | ICD-10-CM | POA: Diagnosis not present

## 2019-09-30 DIAGNOSIS — R55 Syncope and collapse: Secondary | ICD-10-CM | POA: Diagnosis not present

## 2019-09-30 DIAGNOSIS — R059 Cough, unspecified: Secondary | ICD-10-CM

## 2019-09-30 LAB — I-STAT BETA HCG BLOOD, ED (MC, WL, AP ONLY): I-stat hCG, quantitative: <5 m[IU]/mL (ref <5)

## 2019-09-30 LAB — CBC
HCT: 37.8 % (ref 36.0–46.0)
Hemoglobin: 12.5 g/dL (ref 12.0–15.0)
MCH: 29.3 pg (ref 26.0–34.0)
MCHC: 33.1 g/dL (ref 30.0–36.0)
MCV: 88.5 fL (ref 80.0–100.0)
Platelets: 331 10*3/uL (ref 150–400)
RBC: 4.27 MIL/uL (ref 3.87–5.11)
RDW: 14 % (ref 11.5–15.5)
WBC: 8.4 10*3/uL (ref 4.0–10.5)
nRBC: 0 % (ref 0.0–0.2)

## 2019-09-30 LAB — BASIC METABOLIC PANEL
Anion gap: 12 (ref 5–15)
BUN: 19 mg/dL (ref 6–20)
CO2: 26 mmol/L (ref 22–32)
Calcium: 9.1 mg/dL (ref 8.9–10.3)
Chloride: 104 mmol/L (ref 98–111)
Creatinine, Ser: 0.92 mg/dL (ref 0.44–1.00)
GFR calc Af Amer: >60 mL/min (ref >60)
GFR calc non Af Amer: >60 mL/min (ref >60)
Glucose, Bld: 152 mg/dL — ABNORMAL HIGH (ref 70–99)
Potassium: 3.7 mmol/L (ref 3.5–5.1)
Sodium: 142 mmol/L (ref 135–145)

## 2019-09-30 NOTE — ED Triage Notes (Signed)
Pt reports a near syncopal episode earlier today and felt like her heart was racing.

## 2019-10-01 ENCOUNTER — Emergency Department (HOSPITAL_COMMUNITY): Payer: 59

## 2019-10-03 ENCOUNTER — Other Ambulatory Visit: Payer: Self-pay

## 2019-10-04 ENCOUNTER — Encounter: Payer: Self-pay | Admitting: Family Medicine

## 2019-10-04 ENCOUNTER — Ambulatory Visit (INDEPENDENT_AMBULATORY_CARE_PROVIDER_SITE_OTHER): Payer: 59 | Admitting: Family Medicine

## 2019-10-04 VITALS — BP 150/73 | HR 98 | Temp 98.3°F | Wt 205.0 lb

## 2019-10-04 DIAGNOSIS — Z23 Encounter for immunization: Secondary | ICD-10-CM | POA: Diagnosis not present

## 2019-10-04 DIAGNOSIS — I10 Essential (primary) hypertension: Secondary | ICD-10-CM | POA: Diagnosis not present

## 2019-10-04 DIAGNOSIS — R739 Hyperglycemia, unspecified: Secondary | ICD-10-CM

## 2019-10-04 DIAGNOSIS — R0683 Snoring: Secondary | ICD-10-CM | POA: Diagnosis not present

## 2019-10-04 LAB — POCT GLYCOSYLATED HEMOGLOBIN (HGB A1C): Hemoglobin A1C: 6.1 % — AB (ref 4.0–5.6)

## 2019-10-04 MED ORDER — CARVEDILOL 6.25 MG PO TABS: 6.2500 mg | ORAL_TABLET | Freq: Two times a day (BID) | ORAL | 3 refills | Status: DC

## 2019-10-04 NOTE — Patient Instructions (Signed)
Hypertension, Adult Hypertension is another name for high blood pressure. High blood pressure forces your heart to work harder to pump blood. This can cause problems over time. There are two numbers in a blood pressure reading. There is a top number (systolic) over a bottom number (diastolic). It is best to have a blood pressure that is below 120/80. Healthy choices can help lower your blood pressure, or you may need medicine to help lower it. What are the causes? The cause of this condition is not known. Some conditions may be related to high blood pressure. What increases the risk?  Smoking.  Having type 2 diabetes mellitus, high cholesterol, or both.  Not getting enough exercise or physical activity.  Being overweight.  Having too much fat, sugar, calories, or salt (sodium) in your diet.  Drinking too much alcohol.  Having long-term (chronic) kidney disease.  Having a family history of high blood pressure.  Age. Risk increases with age.  Race. You may be at higher risk if you are African American.  Gender. Men are at higher risk than women before age 45. After age 65, women are at higher risk than men.  Having obstructive sleep apnea.  Stress. What are the signs or symptoms?  High blood pressure may not cause symptoms. Very high blood pressure (hypertensive crisis) may cause: ? Headache. ? Feelings of worry or nervousness (anxiety). ? Shortness of breath. ? Nosebleed. ? A feeling of being sick to your stomach (nausea). ? Throwing up (vomiting). ? Changes in how you see. ? Very bad chest pain. ? Seizures. How is this treated?  This condition is treated by making healthy lifestyle changes, such as: ? Eating healthy foods. ? Exercising more. ? Drinking less alcohol.  Your health care provider may prescribe medicine if lifestyle changes are not enough to get your blood pressure under control, and if: ? Your top number is above 130. ? Your bottom number is above  80.  Your personal target blood pressure may vary. Follow these instructions at home: Eating and drinking   If told, follow the DASH eating plan. To follow this plan: ? Fill one half of your plate at each meal with fruits and vegetables. ? Fill one fourth of your plate at each meal with whole grains. Whole grains include whole-wheat pasta, brown rice, and whole-grain bread. ? Eat or drink low-fat dairy products, such as skim milk or low-fat yogurt. ? Fill one fourth of your plate at each meal with low-fat (lean) proteins. Low-fat proteins include fish, chicken without skin, eggs, beans, and tofu. ? Avoid fatty meat, cured and processed meat, or chicken with skin. ? Avoid pre-made or processed food.  Eat less than 1,500 mg of salt each day.  Do not drink alcohol if: ? Your doctor tells you not to drink. ? You are pregnant, may be pregnant, or are planning to become pregnant.  If you drink alcohol: ? Limit how much you use to:  0-1 drink a day for women.  0-2 drinks a day for men. ? Be aware of how much alcohol is in your drink. In the [place] one drink equals one 12 oz bottle of beer (355 mL), one 5 oz glass of wine (148 mL), or one 1 oz glass of hard liquor (44 mL). Lifestyle   Work with your doctor to stay at a healthy weight or to lose weight. Ask your doctor what the best weight is for you.  Get at least 30 minutes of exercise most   days of the week. This may include walking, swimming, or biking.  Get at least 30 minutes of exercise that strengthens your muscles (resistance exercise) at least 3 days a week. This may include lifting weights or doing Pilates.  Do not use any products that contain nicotine or tobacco, such as cigarettes, e-cigarettes, and chewing tobacco. If you need help quitting, ask your doctor.  Check your blood pressure at home as told by your doctor.  Keep all follow-up visits as told by your doctor. This is important. Medicines  Take over-the-counter  and prescription medicines only as told by your doctor. Follow directions carefully.  Do not skip doses of blood pressure medicine. The medicine does not work as well if you skip doses. Skipping doses also puts you at risk for problems.  Ask your doctor about side effects or reactions to medicines that you should watch for. Contact a doctor if you:  Think you are having a reaction to the medicine you are taking.  Have headaches that keep coming back (recurring).  Feel dizzy.  Have swelling in your ankles.  Have trouble with your vision. Get help right away if you:  Get a very bad headache.  Start to feel mixed up (confused).  Feel weak or numb.  Feel faint.  Have very bad pain in your: ? Chest. ? Belly (abdomen).  Throw up more than once.  Have trouble breathing. Summary  Hypertension is another name for high blood pressure.  High blood pressure forces your heart to work harder to pump blood.  For most people, a normal blood pressure is less than 120/80.  Making healthy choices can help lower blood pressure. If your blood pressure does not get lower with healthy choices, you may need to take medicine. This information is not intended to replace advice given to you by your health care provider. Make sure you discuss any questions you have with your health care provider. Document Revised: 08/30/2017 Document Reviewed: 08/30/2017 Elsevier Patient Education  2020 [employer]   Managing Your Hypertension Hypertension is commonly called high blood pressure. This is when the force of your blood pressing against the walls of your arteries is too strong. Arteries are blood vessels that carry blood from your heart throughout your body. Hypertension forces the heart to work harder to pump blood, and may cause the arteries to become narrow or stiff. Having untreated or uncontrolled hypertension can cause heart attack, stroke, kidney disease, and other problems. What are blood  pressure readings? A blood pressure reading consists of a higher number over a lower number. Ideally, your blood pressure should be below 120/80. The first ("top") number is called the systolic pressure. It is a measure of the pressure in your arteries as your heart beats. The second ("bottom") number is called the diastolic pressure. It is a measure of the pressure in your arteries as the heart relaxes. What does my blood pressure reading mean? Blood pressure is classified into four stages. Based on your blood pressure reading, your health care provider may use the following stages to determine what type of treatment you need, if any. Systolic pressure and diastolic pressure are measured in a unit called mm Hg. Normal  Systolic pressure: below 120.  Diastolic pressure: below 80. Elevated  Systolic pressure: 120-129.  Diastolic pressure: below 80. Hypertension stage 1  Systolic pressure: 130-139.  Diastolic pressure: 80-89. Hypertension stage 2  Systolic pressure: 140 or above.  Diastolic pressure: 90 or above. What health risks are associated   with hypertension? Managing your hypertension is an important responsibility. Uncontrolled hypertension can lead to:  A heart attack.  A stroke.  A weakened blood vessel (aneurysm).  Heart failure.  Kidney damage.  Eye damage.  Metabolic syndrome.  Memory and concentration problems. What changes can I make to manage my hypertension? Hypertension can be managed by making lifestyle changes and possibly by taking medicines. Your health care provider will help you make a plan to bring your blood pressure within a normal range. Eating and drinking   Eat a diet that is high in fiber and potassium, and low in salt (sodium), added sugar, and fat. An example eating plan is called the DASH (Dietary Approaches to Stop Hypertension) diet. To eat this way: ? Eat plenty of fresh fruits and vegetables. Try to fill half of your plate at each  meal with fruits and vegetables. ? Eat whole grains, such as whole wheat pasta, brown rice, or whole grain bread. Fill about one quarter of your plate with whole grains. ? Eat low-fat diary products. ? Avoid fatty cuts of meat, processed or cured meats, and poultry with skin. Fill about one quarter of your plate with lean proteins such as fish, chicken without skin, beans, eggs, and tofu. ? Avoid premade and processed foods. These tend to be higher in sodium, added sugar, and fat.  Reduce your daily sodium intake. Most people with hypertension should eat less than 1,500 mg of sodium a day.  Limit alcohol intake to no more than 1 drink a day for nonpregnant women and 2 drinks a day for men. One drink equals 12 oz of beer, 5 oz of wine, or 1 oz of hard liquor. Lifestyle  Work with your health care provider to maintain a healthy body weight, or to lose weight. Ask what an ideal weight is for you.  Get at least 30 minutes of exercise that causes your heart to beat faster (aerobic exercise) most days of the week. Activities may include walking, swimming, or biking.  Include exercise to strengthen your muscles (resistance exercise), such as weight lifting, as part of your weekly exercise routine. Try to do these types of exercises for 30 minutes at least 3 days a week.  Do not use any products that contain nicotine or tobacco, such as cigarettes and e-cigarettes. If you need help quitting, ask your health care provider.  Control any long-term (chronic) conditions you have, such as high cholesterol or diabetes. Monitoring  Monitor your blood pressure at home as told by your health care provider. Your personal target blood pressure may vary depending on your medical conditions, your age, and other factors.  Have your blood pressure checked regularly, as often as told by your health care provider. Working with your health care provider  Review all the medicines you take with your health care  provider because there may be side effects or interactions.  Talk with your health care provider about your diet, exercise habits, and other lifestyle factors that may be contributing to hypertension.  Visit your health care provider regularly. Your health care provider can help you create and adjust your plan for managing hypertension. Will I need medicine to control my blood pressure? Your health care provider may prescribe medicine if lifestyle changes are not enough to get your blood pressure under control, and if:  Your systolic blood pressure is 130 or higher.  Your diastolic blood pressure is 80 or higher. Take medicines only as told by your health care provider. Follow   the directions carefully. Blood pressure medicines must be taken as prescribed. The medicine does not work as well when you skip doses. Skipping doses also puts you at risk for problems. Contact a health care provider if:  You think you are having a reaction to medicines you have taken.  You have repeated (recurrent) headaches.  You feel dizzy.  You have swelling in your ankles.  You have trouble with your vision. Get help right away if:  You develop a severe headache or confusion.  You have unusual weakness or numbness, or you feel faint.  You have severe pain in your chest or abdomen.  You vomit repeatedly.  You have trouble breathing. Summary  Hypertension is when the force of blood pumping through your arteries is too strong. If this condition is not controlled, it may put you at risk for serious complications.  Your personal target blood pressure may vary depending on your medical conditions, your age, and other factors. For most people, a normal blood pressure is less than 120/80.  Hypertension is managed by lifestyle changes, medicines, or both. Lifestyle changes include weight loss, eating a healthy, low-sodium diet, exercising more, and limiting alcohol. This information is not intended to  replace advice given to you by your health care provider. Make sure you discuss any questions you have with your health care provider. Document Revised: 04/13/2018 Document Reviewed: 11/18/2015 Elsevier Patient Education  2020 Elsevier Inc.  

## 2019-10-04 NOTE — Progress Notes (Signed)
Subjective:    Patient ID: Gail Lloyd, female    DOB: 03/18/1965, 54 y.o.   MRN: [id number]  No chief complaint on file.   HPI Patient was seen today for follow-up on blood pressure. Patient was seen in the ED on 9/27 for elevated BP 200/100. Patient has been taking Norvasc 5 mg daily. Patient endorses headaches. States not having salty foods. Patient also notes being active at work and occasionally walking for exercise. Patient states she needs to drink more water. Patient endorses having sleep study 3-4 years ago but does not recall hearing any results from that visit.  Patient inquires about flu vaccine availability.  Past Medical History:  Diagnosis Date  . Heart murmur     Allergies  Allergen Reactions  . Penicillins Hives and Itching  . Sudafed [Pseudoephedrine Hcl] Palpitations    ROS General: Denies fever, chills, night sweats, changes in weight, changes in appetite HEENT: Denies ear pain, changes in vision, rhinorrhea, sore throat +HA CV: Denies CP, palpitations, SOB, orthopnea Pulm: Denies SOB, cough, wheezing GI: Denies abdominal pain, nausea, vomiting, diarrhea, constipation GU: Denies dysuria, hematuria, frequency, vaginal discharge Msk: Denies muscle cramps, joint pains Neuro: Denies weakness, numbness, tingling Skin: Denies rashes, bruising Psych: Denies depression, anxiety, hallucinations      Objective:    Blood pressure (!) 146/82, pulse 98, temperature 98.3 F (36.8 C), temperature source Oral, weight 205 lb (93 kg), SpO2 99 %.   Gen. Pleasant, well-nourished, in no distress, normal affect   HEENT: [place]/AT, face symmetric, conjunctiva clear, no scleral icterus, PERRLA, EOMI, nares patent without drainage, pharynx without erythema or exudate. Lungs: no accessory muscle use, CTAB, no wheezes or rales Cardiovascular: RRR, no m/r/g, no peripheral edema Abdomen: BS present, soft, NT/ND Musculoskeletal: No deformities, no cyanosis or clubbing, normal  tone Neuro:  A&Ox3, CN II-XII intact, normal gait Skin:  Warm, no lesions/ rash   Wt Readings from Last 3 Encounters:  10/04/19 205 lb (93 kg)  02/23/17 207 lb (93.9 kg)  01/05/16 205 lb 3.2 oz (93.1 kg)    Lab Results  Component Value Date   WBC 8.4 09/30/2019   HGB 12.5 09/30/2019   HCT 37.8 09/30/2019   PLT 331 09/30/2019   GLUCOSE 152 (H) 09/30/2019   NA 142 09/30/2019   K 3.7 09/30/2019   CL 104 09/30/2019   CREATININE 0.92 09/30/2019   BUN 19 09/30/2019   CO2 26 09/30/2019    Assessment/Plan:  Essential hypertension  -Elevated -Repeat 150/73 -Discussed lifestyle modifications -Continue Norvasc 5 mg -Will start Coreg 6.25 mg -Patient encouraged to continue checking BP at home and keep a log to bring with her to clinic -Follow-up in 2-4 weeks - Plan: PSG Sleep Study, carvedilol (COREG) 6.25 MG tablet  Need for influenza vaccination  - Plan: Flu Vaccine QUAD 6+ mos PF IM (Fluarix Quad PF)  Snores  -Obtain sleep study to rule out OSA as contributory factor to elevated BP - Plan: PSG Sleep Study  Elevated blood sugar  -Glucose in ED 152 -Discussed lifestyle modifications including decreasing intake of carbs and sweets as well as increasing physical activity. -We will check hemoglobin A1c.  Further recommendations based on results. - Plan: POC HgB A1c  F/u in 2-4 wks  Grier Mitts, MD

## 2019-10-10 ENCOUNTER — Encounter: Payer: Self-pay | Admitting: Family Medicine

## 2019-10-10 DIAGNOSIS — R7303 Prediabetes: Secondary | ICD-10-CM | POA: Insufficient documentation

## 2019-10-10 DIAGNOSIS — R0683 Snoring: Secondary | ICD-10-CM | POA: Insufficient documentation

## 2019-10-10 DIAGNOSIS — I10 Essential (primary) hypertension: Secondary | ICD-10-CM | POA: Insufficient documentation

## 2019-10-22 ENCOUNTER — Telehealth (INDEPENDENT_AMBULATORY_CARE_PROVIDER_SITE_OTHER): Payer: 59 | Admitting: Family Medicine

## 2019-10-22 VITALS — BP 148/82 | HR 83 | Ht 67.0 in | Wt 205.0 lb

## 2019-10-22 DIAGNOSIS — M25511 Pain in right shoulder: Secondary | ICD-10-CM | POA: Diagnosis not present

## 2019-10-22 NOTE — Progress Notes (Signed)
Patient ID: Gail Lloyd, female   DOB: 03/10/1965, 54 y.o.   MRN: [id number]  This visit type was conducted due to national recommendations for restrictions regarding the COVID-19 pandemic in an effort to limit this patient's exposure and mitigate transmission in our community.   Virtual Visit via Telephone Note  I connected with Gail Lloyd on 10/22/19 at 11:30 AM EDT by telephone and verified that I am speaking with the correct person using two identifiers.   I discussed the limitations, risks, security and privacy concerns of performing an evaluation and management service by telephone and the availability of in person appointments. I also discussed with the patient that there may be a patient responsible charge related to this service. The patient expressed understanding and agreed to proceed.  Location patient: home Location provider: work or home office Participants present for the call: patient, provider Patient did not have a visit in the prior 7 days to address this/these issue(s).   History of Present Illness: Gail Lloyd relates right shoulder pain.  Onset yesterday at work.  She works with infants.  Denies any specific injury she noticed sometime around 1 PM yesterday dull pain in the right shoulder.  No neck pain.  By evening this has become sharp and more intense.  No radiculitis symptoms.  No upper extremity numbness or weakness.  Patient took some Tylenol without much relief.  She recalls having Covid vaccine back in April and had very sore arm afterwards.  This pain that was up in the shoulder joint region and worse with abduction.  No fevers or chills.  Past Medical History:  Diagnosis Date  . Heart murmur    Past Surgical History:  Procedure Laterality Date  . WISDOM TOOTH EXTRACTION      reports that she has never smoked. She has never used smokeless tobacco. She reports that she does not drink alcohol and does not use drugs. family history includes Cancer in her father;  Diabetes in her mother; Hypertension in her father and mother. Allergies  Allergen Reactions  . Penicillins Hives and Itching  . Sudafed [Pseudoephedrine Hcl] Palpitations      Observations/Objective: Patient sounds cheerful and well on the phone. I do not appreciate any SOB. Speech and thought processing are grossly intact. Patient reported vitals:  Assessment and Plan: She is describing acute right shoulder pain in the absence of obvious injury but her job requires lifting infants.  Question rotator cuff tendinitis or bursitis.  -Recommend trial of Aleve if she can otherwise tolerate every 12 hours and icing.  Consider over-the-counter topical diclofenac.  If not improving over the next week or 2 recommend follow-up with primary.  Follow Up Instructions:  -As above   99441 5-10 [id number] 11-20 [id number] 21-30 I did not refer this patient for an OV in the next 24 hours for this/these issue(s).  I discussed the assessment and treatment plan with the patient. The patient was provided an opportunity to ask questions and all were answered. The patient agreed with the plan and demonstrated an understanding of the instructions.   The patient was advised to call back or seek an in-person evaluation if the symptoms worsen or if the condition fails to improve as anticipated.  I provided  14 minutes of non-face-to-face time during this encounter.   Carolann Littler, MD

## 2019-10-24 ENCOUNTER — Ambulatory Visit
Admission: RE | Admit: 2019-10-24 | Discharge: 2019-10-24 | Disposition: A | Discharge status: Home / Self Care | Payer: 59 | Source: Ambulatory Visit | Attending: Family Medicine | Admitting: Family Medicine

## 2019-10-24 ENCOUNTER — Other Ambulatory Visit: Payer: Self-pay

## 2019-10-24 DIAGNOSIS — N63 Unspecified lump in unspecified breast: Secondary | ICD-10-CM

## 2019-11-27 ENCOUNTER — Other Ambulatory Visit: Payer: Self-pay

## 2019-11-27 ENCOUNTER — Encounter (HOSPITAL_COMMUNITY): Payer: Self-pay

## 2019-11-27 ENCOUNTER — Emergency Department (HOSPITAL_COMMUNITY)
Admission: EM | Admit: 2019-11-27 | Discharge: 2019-11-27 | Disposition: A | Discharge status: Home / Self Care | Payer: 59 | Attending: Emergency Medicine | Admitting: Emergency Medicine

## 2019-11-27 ENCOUNTER — Emergency Department (HOSPITAL_COMMUNITY): Payer: 59

## 2019-11-27 DIAGNOSIS — R413 Other amnesia: Secondary | ICD-10-CM | POA: Diagnosis not present

## 2019-11-27 DIAGNOSIS — R410 Disorientation, unspecified: Secondary | ICD-10-CM | POA: Diagnosis present

## 2019-11-27 DIAGNOSIS — Z79899 Other long term (current) drug therapy: Secondary | ICD-10-CM | POA: Insufficient documentation

## 2019-11-27 DIAGNOSIS — B379 Candidiasis, unspecified: Secondary | ICD-10-CM | POA: Diagnosis not present

## 2019-11-27 DIAGNOSIS — I10 Essential (primary) hypertension: Secondary | ICD-10-CM | POA: Diagnosis not present

## 2019-11-27 LAB — COMPREHENSIVE METABOLIC PANEL
ALT: 15 U/L (ref 0–44)
AST: 14 U/L — ABNORMAL LOW (ref 15–41)
Albumin: 4.1 g/dL (ref 3.5–5.0)
Alkaline Phosphatase: 67 U/L (ref 38–126)
Anion gap: 11 (ref 5–15)
BUN: 12 mg/dL (ref 6–20)
CO2: 25 mmol/L (ref 22–32)
Calcium: 9.4 mg/dL (ref 8.9–10.3)
Chloride: 101 mmol/L (ref 98–111)
Creatinine, Ser: 0.66 mg/dL (ref 0.44–1.00)
GFR, Estimated: >60 mL/min (ref >60)
Glucose, Bld: 117 mg/dL — ABNORMAL HIGH (ref 70–99)
Potassium: 3.3 mmol/L — ABNORMAL LOW (ref 3.5–5.1)
Sodium: 137 mmol/L (ref 135–145)
Total Bilirubin: 0.8 mg/dL (ref 0.3–1.2)
Total Protein: 8.8 g/dL — ABNORMAL HIGH (ref 6.5–8.1)

## 2019-11-27 LAB — CBC WITH DIFFERENTIAL/PLATELET
Abs Immature Granulocytes: 0.02 10*3/uL (ref 0.00–0.07)
Basophils Absolute: 0 10*3/uL (ref 0.0–0.1)
Basophils Relative: 0 %
Eosinophils Absolute: 0.1 10*3/uL (ref 0.0–0.5)
Eosinophils Relative: 1 %
HCT: 40.2 % (ref 36.0–46.0)
Hemoglobin: 13.2 g/dL (ref 12.0–15.0)
Immature Granulocytes: 0 %
Lymphocytes Relative: 15 %
Lymphs Abs: 1.6 10*3/uL (ref 0.7–4.0)
MCH: 29.6 pg (ref 26.0–34.0)
MCHC: 32.8 g/dL (ref 30.0–36.0)
MCV: 90.1 fL (ref 80.0–100.0)
Monocytes Absolute: 0.7 10*3/uL (ref 0.1–1.0)
Monocytes Relative: 7 %
Neutro Abs: 8 10*3/uL — ABNORMAL HIGH (ref 1.7–7.7)
Neutrophils Relative %: 77 %
Platelets: 377 10*3/uL (ref 150–400)
RBC: 4.46 MIL/uL (ref 3.87–5.11)
RDW: 14 % (ref 11.5–15.5)
WBC: 10.4 10*3/uL (ref 4.0–10.5)
nRBC: 0 % (ref 0.0–0.2)

## 2019-11-27 LAB — ETHANOL: Alcohol, Ethyl (B): <10 mg/dL (ref <10)

## 2019-11-27 LAB — URINALYSIS, ROUTINE W REFLEX MICROSCOPIC
Bilirubin Urine: NEGATIVE
Glucose, UA: NEGATIVE mg/dL
Ketones, ur: NEGATIVE mg/dL
Leukocytes,Ua: NEGATIVE
Nitrite: NEGATIVE
Protein, ur: NEGATIVE mg/dL
Specific Gravity, Urine: 1.009 (ref 1.005–1.030)
pH: 6 (ref 5.0–8.0)

## 2019-11-27 LAB — RAPID URINE DRUG SCREEN, HOSP PERFORMED
Amphetamines: NOT DETECTED
Barbiturates: NOT DETECTED
Benzodiazepines: NOT DETECTED
Cocaine: NOT DETECTED
Opiates: NOT DETECTED
Tetrahydrocannabinol: NOT DETECTED

## 2019-11-27 LAB — CBG MONITORING, ED: Glucose-Capillary: 115 mg/dL — ABNORMAL HIGH (ref 70–99)

## 2019-11-27 LAB — PREGNANCY, URINE: Preg Test, Ur: NEGATIVE

## 2019-11-27 MED ORDER — SODIUM CHLORIDE 0.9 % IV BOLUS
1000.0000 mL | Freq: Once | INTRAVENOUS | Status: AC
  Administered 2019-11-27: 1000 mL via INTRAVENOUS

## 2019-11-27 MED ORDER — POTASSIUM CHLORIDE CRYS ER 20 MEQ PO TBCR
40.0000 meq | EXTENDED_RELEASE_TABLET | Freq: Once | ORAL | Status: AC
  Administered 2019-11-27: 40 meq via ORAL
  Filled 2019-11-27: qty 2

## 2019-11-27 MED ORDER — FLUCONAZOLE 150 MG PO TABS
150.0000 mg | ORAL_TABLET | Freq: Once | ORAL | Status: AC
  Administered 2019-11-27: 150 mg via ORAL
  Filled 2019-11-27: qty 1

## 2019-11-27 NOTE — Discharge Instructions (Addendum)
Your MRI does not show a stroke.  You will need to follow-up with neurology as an outpatient.

## 2019-11-27 NOTE — ED Notes (Addendum)
Pt exhibiting signs of memory impairment. Asked Probation officer several times how she got to ED, though remembers she is at [employer]. Called her daughter in front of this Probation officer to ask how she arrived to hospital. Pt's daughter said, "You drove yourself, mama." Pt also asked Probation officer, "So tomorrow is Thanksgiving? Are you sure?"   Needs frequent reorientation. Pt passed neuro screen. Denies numbness, tingling, headache, dizziness, or visual changes. PERRL noted. Strong bilateral hand grip and normal bilateral dorsiflexion.

## 2019-11-27 NOTE — ED Notes (Signed)
Pt rounding complete. Pt resting comfortably with family at bedside. Meal and beverage provided. Bed locked in lowest position and side rails up. Vitals updated. All needs met at this time. Will continue to monitor.

## 2019-11-27 NOTE — ED Triage Notes (Addendum)
Pt arrived via POV, states she "feels like I'm going to pass out" , pt unable to articulate exactly what she is feeling. Pt aox4, CBG 115, speaking in full sentences. Pt repeatedly stating "I feel like i'm going to pass out" Denies any lightheadedness, dizziness, CP or SOB at this time.

## 2019-11-27 NOTE — ED Provider Notes (Signed)
[facility] DEPT Provider Note   CSN: [id number] Arrival date & time: 11/27/19  1410     History No chief complaint on file.   Gail Lloyd is a 54 y.o. female.  HPI 54 year old female presents with confusion. She is not sure why she is in the emergency department. She does know she is in the emergency department but is not sure how she got here or why she is here. She last remembers being in the grocery store. She denies any acute illness or discomfort including no dizziness, headache, chest pain, shortness of breath, or weakness. Has never had this before. Nurse tech told me they had to tell her tomorrow was Thanksgiving prior to me seeing her. Otherwise she knows who she is and where she works.    Past Medical History:  Diagnosis Date  . Heart murmur     Patient Active Problem List   Diagnosis Date Noted  . Prediabetes 10/10/2019  . Essential hypertension 10/10/2019  . Snores 10/10/2019    Past Surgical History:  Procedure Laterality Date  . WISDOM TOOTH EXTRACTION       OB History    Gravida  1   Para      Term      Preterm      AB      Living  1     SAB      TAB      Ectopic      Multiple      Live Births              Family History  Problem Relation Age of Onset  . Diabetes Mother   . Hypertension Mother   . Hypertension Father   . Cancer Father     Social History   Tobacco Use  . Smoking status: Never Smoker  . Smokeless tobacco: Never Used  Substance Use Topics  . Alcohol use: No  . Drug use: No    Home Medications Prior to Admission medications   Medication Sig Start Date End Date Taking? Authorizing Provider  amLODipine (NORVASC) 5 MG tablet Take 1 tablet (5 mg total) by mouth daily. 01/30/19  Yes Burchette, Alinda Sierras, MD  Ascorbic Acid (VITAMIN C) 1000 MG tablet Take 1,000 mg by mouth daily.   Yes [provider]  carvedilol (COREG) 6.25 MG tablet Take 1 tablet (6.25 mg total) by  mouth 2 (two) times daily with a meal. 10/04/19  Yes Billie Ruddy, MD  diphenhydrAMINE (BENADRYL) 25 MG tablet Take 25 mg by mouth every 6 (six) hours as needed for allergies.   Yes [provider]  melatonin 5 MG TABS Take 5 mg by mouth daily as needed (sleep).   Yes [provider]  Multiple Vitamin (MULTIVITAMIN WITH MINERALS) TABS tablet Take 1 tablet by mouth daily.   Yes [provider]  chlorpheniramine-HYDROcodone (TUSSIONEX PENNKINETIC ER) 10-8 MG/5ML SUER Take 5 mLs by mouth at bedtime as needed. Patient not taking: Reported on 10/22/2019 01/07/16   Ivar Drape D, PA  fluticasone (FLONASE) 50 MCG/ACT nasal spray Place 2 sprays into both nostrils daily. Patient not taking: Reported on 11/27/2019 02/23/17   Ok Edwards, PA-C  Guaifenesin [facility] MAXIMUM STRENGTH) 1200 MG TB12 Take 1 tablet (1,200 mg total) by mouth every 12 (twelve) hours as needed. Patient not taking: Reported on 10/22/2019 01/05/16   Ivar Drape D, PA  ipratropium (ATROVENT) 0.06 % nasal spray Place 2 sprays into both nostrils 4 (  four) times daily. Patient not taking: Reported on 10/22/2019 02/23/17   Ok Edwards, PA-C    Allergies    Penicillins and Sudafed [pseudoephedrine hcl]  Review of Systems   Review of Systems  Constitutional: Negative for fever.  Respiratory: Negative for shortness of breath.   Cardiovascular: Negative for chest pain.  Gastrointestinal: Negative for vomiting.  Musculoskeletal: Negative for neck pain.  Neurological: Negative for weakness, numbness and headaches.  Psychiatric/Behavioral: Positive for confusion.  All other systems reviewed and are negative.   Physical Exam Updated Vital Signs BP (!) 147/77   Pulse 94   Temp 98.3 F (36.8 C)   Resp 16   SpO2 100%   Physical Exam Vitals and nursing note reviewed.  Constitutional:      Appearance: She is well-developed.  HENT:     Head: Normocephalic and atraumatic.     Right Ear: External  ear normal.     Left Ear: External ear normal.     Nose: Nose normal.  Eyes:     General:        Right eye: No discharge.        Left eye: No discharge.     Extraocular Movements: Extraocular movements intact.     Pupils: Pupils are equal, round, and reactive to light.  Cardiovascular:     Rate and Rhythm: Normal rate and regular rhythm.     Heart sounds: Normal heart sounds.  Pulmonary:     Effort: Pulmonary effort is normal.     Breath sounds: Normal breath sounds.  Abdominal:     General: There is no distension.     Palpations: Abdomen is soft.     Tenderness: There is no abdominal tenderness.  Musculoskeletal:     Cervical back: Normal range of motion and neck supple. No rigidity.  Skin:    General: Skin is warm and dry.  Neurological:     Mental Status: She is alert and oriented to person, place, and time.     Comments: CN 3-12 grossly intact. 5/5 strength in all 4 extremities. Grossly normal sensation. Normal finger to nose.   Psychiatric:        Mood and Affect: Mood is not anxious.     ED Results / Procedures / Treatments   Labs (all labs ordered are listed, but only abnormal results are displayed) Labs Reviewed  COMPREHENSIVE METABOLIC PANEL - Abnormal; Notable for the following components:      Result Value   Potassium 3.3 (*)    Glucose, Bld 117 (*)    Total Protein 8.8 (*)    AST 14 (*)    All other components within normal limits  URINALYSIS, ROUTINE W REFLEX MICROSCOPIC - Abnormal; Notable for the following components:   Color, Urine STRAW (*)    Hgb urine dipstick SMALL (*)    Bacteria, UA RARE (*)    All other components within normal limits  CBC WITH DIFFERENTIAL/PLATELET - Abnormal; Notable for the following components:   Neutro Abs 8.0 (*)    All other components within normal limits  CBG MONITORING, ED - Abnormal; Notable for the following components:   Glucose-Capillary 115 (*)    All other components within normal limits  ETHANOL  RAPID  URINE DRUG SCREEN, HOSP PERFORMED  PREGNANCY, URINE    EKG EKG Interpretation  Date/Time:  Wednesday November 27 2019 16:54:18 EST Ventricular Rate:  98 PR Interval:    QRS Duration: 101 QT Interval:  381 QTC Calculation: 487  R Axis:   22 Text Interpretation: Sinus rhythm Borderline prolonged QT interval no significant change since Sept 2021 Confirmed by Sherwood Gambler [phone]) on 11/27/2019 5:08:56 PM   Radiology CT Head Wo Contrast  Result Date: 11/27/2019 CLINICAL DATA:  Altered mental status. EXAM: CT HEAD WITHOUT CONTRAST TECHNIQUE: Contiguous axial images were obtained from the base of the skull through the vertex without intravenous contrast. COMPARISON:  None. FINDINGS: Brain: No evidence of acute infarction, hemorrhage, hydrocephalus, extra-axial collection or mass lesion/mass effect. A 3 mm white matter calcification is seen within the posterolateral aspect of the cerebellum on the left. Moderate severity calcification is seen along the interhemispheric fissure. Vascular: No hyperdense vessels are identified. Skull: Normal. Negative for fracture or focal lesion. Sinuses/Orbits: No acute finding. Other: None. IMPRESSION: No acute intracranial abnormality. Electronically Signed   By: Virgina Norfolk M.D.   On: 11/27/2019 16:18   MR BRAIN WO CONTRAST  Result Date: 11/27/2019 CLINICAL DATA:  Mental status change. EXAM: MRI HEAD WITHOUT CONTRAST TECHNIQUE: Multiplanar, multiecho pulse sequences of the brain and surrounding structures were obtained without intravenous contrast. COMPARISON:  Head CT November 27, 2019. FINDINGS: Brain: No acute infarction, hemorrhage, hydrocephalus, extra-axial collection or mass lesion. Small amount of scattered foci of T2 hyperintensity are seen within white matter of the cerebral hemispheres, nonspecific. Vascular: Normal flow voids. Skull and upper cervical spine: No focal marrow lesion. Sinuses/Orbits: Mucosal thickening of the sphenoid sinuses.  The orbits are maintained. Other: None. IMPRESSION: 1. No acute intracranial abnormality. 2. Small amount of scattered foci of T2 hyperintensity within the white matter of the cerebral hemispheres, nonspecific but may be seen in the setting of chronic small vessel ischemic disease. Electronically Signed   By: Pedro Earls M.D.   On: 11/27/2019 19:28    Procedures Procedures (including critical care time)  Medications Ordered in ED Medications  sodium chloride 0.9 % bolus 1,000 mL (0 mLs Intravenous Stopped 11/27/19 2159)  potassium chloride SA (KLOR-CON) CR tablet 40 mEq (40 mEq Oral Given 11/27/19 2032)  fluconazole (DIFLUCAN) tablet 150 mg (150 mg Oral Given 11/27/19 2033)    ED Course  I have reviewed the triage vital signs and the nursing notes.  Pertinent labs & imaging results that were available during my care of the patient were reviewed by me and considered in my medical decision making (see chart for details).    MDM Rules/Calculators/A&P                          Patient has no focal disability on exam.  She cannot remember what happened earlier in the day but otherwise has a benign neuro exam.  I discussed with Dr. Lorrin Goodell due to the fact that she was technically within the code stroke window however given no deficits now would not be a TPA candidate.  Agrees with MRI.  Labs are otherwise reassuring besides mild hypokalemia.  She is noted to have yeast in her urine and while she does not have symptoms of a yeast infection I think it would be beneficial to give Diflucan.  MRI images reviewed and no obvious acute stroke.  Will discharge home with outpatient neuro follow-up. Final Clinical Impression(s) / ED Diagnoses Final diagnoses:  Yeast detected  Memory loss of unknown cause    Rx / DC Orders ED Discharge Orders         Ordered    Ambulatory referral to Neurology  Comments: An appointment is requested in approximately: 2 weeks   11/27/19 1942            Sherwood Gambler, MD 11/27/19 2345

## 2019-11-27 NOTE — ED Notes (Signed)
Pt transported to MRI 

## 2019-12-02 ENCOUNTER — Ambulatory Visit: Payer: 59 | Admitting: Family Medicine

## 2019-12-02 ENCOUNTER — Other Ambulatory Visit: Payer: Self-pay

## 2019-12-02 ENCOUNTER — Encounter: Payer: Self-pay | Admitting: Family Medicine

## 2019-12-02 VITALS — BP 138/82 | HR 89 | Temp 98.0°F | Wt 200.6 lb

## 2019-12-02 DIAGNOSIS — R7303 Prediabetes: Secondary | ICD-10-CM

## 2019-12-02 DIAGNOSIS — R410 Disorientation, unspecified: Secondary | ICD-10-CM | POA: Diagnosis not present

## 2019-12-02 DIAGNOSIS — I10 Essential (primary) hypertension: Secondary | ICD-10-CM

## 2019-12-02 DIAGNOSIS — E876 Hypokalemia: Secondary | ICD-10-CM

## 2019-12-02 DIAGNOSIS — R55 Syncope and collapse: Secondary | ICD-10-CM

## 2019-12-02 DIAGNOSIS — J302 Other seasonal allergic rhinitis: Secondary | ICD-10-CM

## 2019-12-02 MED ORDER — FLUTICASONE PROPIONATE 50 MCG/ACT NA SUSP: 1.0000 | Freq: Every day | NASAL | 0 refills | Status: DC

## 2019-12-02 NOTE — Progress Notes (Signed)
Subjective:    Patient ID: Gail Lloyd, female    DOB: 1966-01-03, 54 y.o.   MRN: [id number]  No chief complaint on file.   HPI Patient was seen today for ED f/u.  Patient seen in ED on 11/27/2019 for confusion.  At the time of visit patient was unsure why she was there.  CT head and MRI brain negative.  Yeast noted in urine, treated with Diflucan.  Patient was hypokalemic at 3.3, given K. Dur 40 mEq.  Pt states the days she went to the ED she felt presyncopal in the car while leaving the grocery store after 1 PM.  She drove to the ED, but could not remember getting from the car into the ED.  Patient was speaking with her daughter on the phone during the entire car ride.  Patient mentions she did not eat breakfast or drink anything that day.  While in the car she ate a banana and cheese crackers.  Patient endorses 1-2 episodes since the ED visit.  States BP has been okay at home 130/70, 149/80.  Taking Norvasc 5 mg and Coreg 6.25 mg twice daily.  States right ear feels like there is fluid in it.  Past Medical History:  Diagnosis Date  . Heart murmur     Allergies  Allergen Reactions  . Penicillins Hives and Itching  . Sudafed [Pseudoephedrine Hcl] Palpitations    ROS General: Denies fever, chills, night sweats, changes in weight, changes in appetite  +confusion, presyncope HEENT: Denies headaches, ear pain, changes in vision, rhinorrhea, sore throat  +R ear with fluid CV: Denies CP, palpitations, SOB, orthopnea Pulm: Denies SOB, cough, wheezing GI: Denies abdominal pain, nausea, vomiting, diarrhea, constipation GU: Denies dysuria, hematuria, frequency, vaginal discharge Msk: Denies muscle cramps, joint pains Neuro: Denies weakness, numbness, tingling Skin: Denies rashes, bruising Psych: Denies depression, anxiety, hallucinations      Objective:    Blood pressure 138/82, pulse 89, temperature 98 F (36.7 C), temperature source Oral, weight 200 lb 9.6 oz (91 kg), SpO2 98  %.  Gen. Pleasant, well-nourished, in no distress, normal affect   HEENT: [employer]/AT, face symmetric, tongue midline, conjunctiva clear, no scleral icterus, PERRLA, EOMI, nares patent without drainage, pharynx without erythema or exudate.  Normal external ears and canals.  Bilateral TMs full. Neck: No JVD, no thyromegaly, no carotid bruits Lungs: no accessory muscle use, CTAB, no wheezes or rales Cardiovascular: RRR, no m/r/g, no peripheral edema Musculoskeletal: No deformities, no cyanosis or clubbing, normal tone Neuro:  A&Ox3, CN II-XII intact, normal gait Skin:  Warm, no lesions/ rash   Wt Readings from Last 3 Encounters:  10/22/19 205 lb (93 kg)  10/04/19 205 lb (93 kg)  02/23/17 207 lb (93.9 kg)    Lab Results  Component Value Date   WBC 10.4 11/27/2019   HGB 13.2 11/27/2019   HCT 40.2 11/27/2019   PLT 377 11/27/2019   GLUCOSE 117 (H) 11/27/2019   ALT 15 11/27/2019   AST 14 (L) 11/27/2019   NA 137 11/27/2019   K 3.3 (L) 11/27/2019   CL 101 11/27/2019   CREATININE 0.66 11/27/2019   BUN 12 11/27/2019   CO2 25 11/27/2019   HGBA1C 6.1 (A) 10/04/2019    Assessment/Plan:  Near syncope  -Likely 2/2 hypoglycemia as patient had not eaten or had anything to drink all day.  Blood sugar normal at hospitalist patient ate banana and crackers in the car on the way to the ED. -Imaging from ED on 11/27/2019 including  MRI of brain and CT head reviewed as well as labs. -Discussed eating regular meals including breakfast and drinking plenty of water -We will obtain labs -Given handout - Plan: CBC with Differential/Platelet, TSH, T4, free, CMP with eGFR([facility]), Hemoglobin A1c, Lipid panel  Prediabetes -Hemoglobin A1c 6.1%, 10/04/2019 - Plan: Hemoglobin A1c  Hypokalemia  -Potassium 3.3 in ED 11/27/2019 -Repleted in ED -We will recheck - Plan: CMP with eGFR([facility])  Confusion -Likely 2/2 hypoglycemia -We will continue to monitor -We will obtain TSH, free T4, CMP, A1c, lipid  panel  Essential hypertension -Improving -138/82 this visit -Continue current medications including Norvasc 5 mg and Coreg 6.25 mg twice daily -Continue lifestyle modifications -Patient encouraged to check BP at home and keep a log to bring her to clinic -Plan: CMP  Seasonal allergies -Likely causing sensation of fluid in right ear - Plan: fluticasone (FLONASE) 50 MCG/ACT nasal spray  F/u in 1 month, sooner if needed  Grier Mitts, MD

## 2019-12-02 NOTE — Patient Instructions (Signed)
Confusion Confusion is the inability to think with the usual speed or clarity. People who are confused often describe their thinking as cloudy or unclear. Confusion can also include feeling disoriented. This means you are unaware of where you are or who you are. You may also not know the date or time. When confused, you may have difficulty remembering, paying attention, or making decisions. Some people also act aggressively when they are confused. In some cases, confusion may come on quickly. In other cases, it may develop slowly over time. How quickly confusion comes on depends on the cause. Confusion may be caused by:  Head injury (concussion).  Seizures.  Stroke.  Fever.  Brain tumor.  Decrease in brain function due to a vascular or neurologic condition (dementia).  Emotions, like rage or terror.  Inability to know what is real and what is not (hallucinations).  Infections, such as a urinary tract infection (UTI).  Using too much alcohol, drugs, or medicines.  Loss of fluid (dehydration) or an imbalance of salts in the body (electrolytes).  Lack of sleep.  Low blood sugar (diabetes).  Low levels of oxygen. This comes from conditions such as chronic lung disorders.  Side effects of medicines, or taking medicines that affect other medicines (drug interactions).  Lack of certain nutrients, especially niacin, thiamine, vitamin C, or vitamin B.  Sudden drop in body temperature (hypothermia).  Change in routine, such as traveling or being hospitalized. Follow these instructions at home: Pay attention to your symptoms. Tell your health care provider about any changes or if you develop new symptoms. Follow these instructions to control or treat symptoms. Ask a family member or friend for help if needed. Medicines  Take over-the-counter and prescription medicines only as told by your health care provider.  Ask your health care provider about changing or stopping any medicines  that may be causing your confusion.  Avoid pain medicines or sleep medicines until you have fully recovered.  Use a pillbox or an alarm to help you take the right medicines at the right time. Lifestyle   Eat a balanced diet that includes fruits and vegetables.  Get enough sleep. For most adults, this is 7-9 hours each night.  Do not drink alcohol.  Do not become isolated. Spend time with other people and make plans for your days.  Do not drive until your health care provider says that it is safe to do so.  Do not use any products that contain nicotine or tobacco, such as cigarettes and e-cigarettes. If you need help quitting, ask your health care provider.  Stop other activities that may increase your chances of getting hurt. These may include some work duties, sports activities, swimming, or bike riding. Ask your health care provider what activities are safe for you. What caregivers can do  Find out if the person is confused. Ask the person to state his or her name, age, and the date. If the person is unsure or answers incorrectly, he or she may be confused.  Always introduce yourself, no matter how well the person knows you.  Remind the person of his or her location. Do this often.  Place a calendar and clock near the person who is confused.  Talk about current events and plans for the day.  Keep the environment calm, quiet, and peaceful.  Help the person do the things that he or she is unable to do. These include: ? Taking medicines. ? Keeping follow-up visits with his or her health care  provider. ? Helping with household duties, including meal preparation. ? Running errands.  Get help if you need it. There are several support groups for caregivers.  If the person you are helping needs more support, consider day care, extended care programs, or a skilled nursing facility. The person's health care provider may be able to help evaluate these options. General  instructions  Monitor yourself for any conditions you may have. These may include: ? Checking your blood glucose levels, if you have diabetes. ? Watching your weight, if you are overweight. ? Monitoring your blood pressure, if you have hypertension. ? Monitoring your body temperature, if you have a fever.  Keep all follow-up visits as told by your health care provider. This is important. Contact a health care provider if:  Your symptoms get worse. Get help right away if you:  Feel that you are not able to care for yourself.  Develop severe headaches, repeated vomiting, seizures, blackouts, or slurred speech.  Have increasing confusion, weakness, numbness, restlessness, or personality changes.  Develop a loss of balance, have marked dizziness, feel uncoordinated, or fall.  Develop severe anxiety, or you have delusions or hallucinations. These symptoms may represent a serious problem that is an emergency. Do not wait to see if the symptoms will go away. Get medical help right away. Call your local emergency services (911 in the [place] Do not drive yourself to the hospital. Summary  Confusion is the inability to think with the usual speed or clarity. People who are confused often describe their thinking as cloudy or unclear.  Confusion can also include having difficulty remembering, paying attention, or making decisions.  Confusion may come on quickly or develop slowly over time, depending on the cause. There are many different causes of confusion.  Ask for help from family members or friends if you are unable to take care of yourself. This information is not intended to replace advice given to you by your health care provider. Make sure you discuss any questions you have with your health care provider. Document Revised: 12/22/2016 Document Reviewed: 12/22/2016 Elsevier Patient Education  [place].  Near-Syncope Near-syncope is when you suddenly get weak or dizzy, or you  feel like you might pass out (faint). This may also be called presyncope. This is due to a lack of blood flow to the brain. During an episode of near-syncope, you may:  Feel dizzy, weak, or light-headed.  Feel sick to your stomach (nauseous).  See all white or all black.  See spots.  Have cold, clammy skin. This condition is caused by a sudden decrease in blood flow to the brain. This decrease can result from various causes, but most of those causes are not dangerous. However, near-syncope may be a sign of a serious medical problem, so it is important to seek medical care. Follow these instructions at home: Medicines  Take over-the-counter and prescription medicines only as told by your doctor.  If you are taking blood pressure or heart medicine, get up slowly and spend many minutes getting ready to sit and then stand. This can help with dizziness. General instructions  Be aware of any changes in your symptoms.  Talk with your doctor about your symptoms. You may need to have testing to find the cause of your near-syncope.  If you start to feel like you might pass out, lie down right away. Raise (elevate) your feet above the level of your heart. Breathe deeply and steadily. Wait until all of the symptoms are  gone.  Have someone stay with you until you feel stable.  Do not drive, use machinery, or play sports until your doctor says it is okay.  Drink enough fluid to keep your pee (urine) pale yellow.  Keep all follow-up visits as told by your doctor. This is important. Get help right away if you:  Have a seizure.  Have pain in your: ? Chest. ? Belly (abdomen). ? Back.  Faint once or more than once.  Have a very bad headache.  Are bleeding from your mouth or butt.  Have black or tarry poop (stool).  Have a very fast or uneven heartbeat (palpitations).  Are mixed up (confused).  Have trouble walking.  Are very weak.  Have trouble seeing. These symptoms may be an  emergency. Do not wait to see if the symptoms will go away. Get medical help right away. Call your local emergency services (911 in the [place] Do not drive yourself to the hospital. Summary  Near-syncope is when you suddenly get weak or dizzy, or you feel like you might pass out (faint).  This condition is caused by a lack of blood flow to the brain.  Near-syncope may be a sign of a serious medical problem, so it is important to seek medical care. This information is not intended to replace advice given to you by your health care provider. Make sure you discuss any questions you have with your health care provider. Document Revised: 04/13/2018 Document Reviewed: 11/08/2017 Elsevier Patient Education  [place].  Prediabetes Prediabetes is the condition of having a blood sugar (blood glucose) level that is higher than it should be, but not high enough for you to be diagnosed with type 2 diabetes. Having prediabetes puts you at risk for developing type 2 diabetes (type 2 diabetes mellitus). Prediabetes may be called impaired glucose tolerance or impaired fasting glucose. Prediabetes usually does not cause symptoms. Your health care provider can diagnose this condition with blood tests. You may be tested for prediabetes if you are overweight and if you have at least one other risk factor for prediabetes. What is blood glucose, and how is it measured? Blood glucose refers to the amount of glucose in your bloodstream. Glucose comes from eating foods that contain sugars and starches (carbohydrates), which the body breaks down into glucose. Your blood glucose level may be measured in mg/dL (milligrams per deciliter) or mmol/L (millimoles per liter). Your blood glucose may be checked with one or more of the following blood tests:  A fasting blood glucose (FBG) test. You will not be allowed to eat (you will fast) for 8 hours or longer before a blood sample is taken. ? A normal range for FBG is  70-100 mg/dl (3.9-5.6 mmol/L).  An A1c (hemoglobin A1c) blood test. This test provides information about blood glucose control over the previous 2?28month.  An oral glucose tolerance test (OGTT). This test measures your blood glucose at two times: ? After fasting. This is your baseline level. ? Two hours after you drink a beverage that contains glucose. You may be diagnosed with prediabetes:  If your FBG is 100?125 mg/dL (5.6-6.9 mmol/L).  If your A1c level is 5.7?6.4%.  If your OGTT result is 140?199 mg/dL (7.8-11 mmol/L). These blood tests may be repeated to confirm your diagnosis. How can this condition affect me? The pancreas produces a hormone (insulin) that helps to move glucose from the bloodstream into cells. When cells in the body do not respond properly to insulin that  the body makes (insulin resistance), excess glucose builds up in the blood instead of going into cells. As a result, high blood glucose (hyperglycemia) can develop, which can cause many complications. Hyperglycemia is a symptom of prediabetes. Having high blood glucose for a long time is dangerous. Too much glucose in your blood can damage your nerves and blood vessels. Long-term damage can lead to complications from diabetes, which may include:  Heart disease.  Stroke.  Blindness.  Kidney disease.  Depression.  Poor circulation in the feet and legs, which could lead to surgical removal (amputation) in severe cases. What can increase my risk? Risk factors for prediabetes include:  Having a family member with type 2 diabetes.  Being overweight or obese.  Being older than age 38.  Being of American [place], African-American, Hispanic/Latino, or Asian/Pacific Islander descent.  Having an inactive (sedentary) lifestyle.  Having a history of heart disease.  History of gestational diabetes or polycystic ovary syndrome (PCOS), in women.  Having low levels of good cholesterol (HDL-C) or high levels of  blood fats (triglycerides).  Having high blood pressure. What actions can I take to prevent diabetes?      Be physically active. ? Do moderate-intensity physical activity for 30 or more minutes on 5 or more days of the week, or as much as told by your health care provider. This could be brisk walking, biking, or water aerobics. ? Ask your health care provider what activities are safe for you. A mix of physical activities may be best, such as walking, swimming, cycling, and strength training.  Lose weight as told by your health care provider. ? Losing 5-7% of your body weight can reverse insulin resistance. ? Your health care provider can determine how much weight loss is best for you and can help you lose weight safely.  Follow a healthy meal plan. This includes eating lean proteins, complex carbohydrates, fresh fruits and vegetables, low-fat dairy products, and healthy fats. ? Follow instructions from your health care provider about eating or drinking restrictions. ? Make an appointment to see a diet and nutrition specialist (registered dietitian) to help you create a healthy eating plan that is right for you.  Do not smoke or use any tobacco products, such as cigarettes, chewing tobacco, and e-cigarettes. If you need help quitting, ask your health care provider.  Take over-the-counter and prescription medicines as told by your health care provider. You may be prescribed medicines that help lower the risk of type 2 diabetes.  Keep all follow-up visits as told by your health care provider. This is important. Summary  Prediabetes is the condition of having a blood sugar (blood glucose) level that is higher than it should be, but not high enough for you to be diagnosed with type 2 diabetes.  Having prediabetes puts you at risk for developing type 2 diabetes (type 2 diabetes mellitus).  To help prevent type 2 diabetes, make lifestyle changes such as being physically active and eating a  healthy diet. Lose weight as told by your health care provider. This information is not intended to replace advice given to you by your health care provider. Make sure you discuss any questions you have with your health care provider. Document Revised: 04/13/2018 Document Reviewed: 02/10/2015 Elsevier Patient Education  [place].  Hypoglycemia Hypoglycemia is when the sugar (glucose) level in your blood is too low. Signs of low blood sugar may include:  Feeling: ? Hungry. ? Worried or nervous (anxious). ? Sweaty and clammy. ?  Confused. ? Dizzy. ? Sleepy. ? Sick to your stomach (nauseous).  Having: ? A fast heartbeat. ? A headache. ? A change in your vision. ? Tingling or no feeling (numbness) around your mouth, lips, or tongue. ? Jerky movements that you cannot control (seizure).  Having trouble with: ? Moving (coordination). ? Sleeping. ? Passing out (fainting). ? Getting upset easily (irritability). Low blood sugar can happen to people who have diabetes and people who do not have diabetes. Low blood sugar can happen quickly, and it can be an emergency. Treating low blood sugar Low blood sugar is often treated by eating or drinking something sugary right away, such as:  Fruit juice, 4-6 oz (120-150 mL).  Regular soda (not diet soda), 4-6 oz (120-150 mL).  Low-fat milk, 4 oz (120 mL).  Several pieces of hard candy.  Sugar or honey, 1 Tbsp (15 mL). Treating low blood sugar if you have diabetes If you can think clearly and swallow safely, follow the 15:15 rule:  Take 15 grams of a fast-acting carb (carbohydrate). Talk with your doctor about how much you should take.  Always keep a source of fast-acting carb with you, such as: ? Sugar tablets (glucose pills). Take 3-4 pills. ? 6-8 pieces of hard candy. ? 4-6 oz (120-150 mL) of fruit juice. ? 4-6 oz (120-150 mL) of regular (not diet) soda. ? 1 Tbsp (15 mL) honey or sugar.  Check your blood sugar 15 minutes  after you take the carb.  If your blood sugar is still at or below 70 mg/dL (3.9 mmol/L), take 15 grams of a carb again.  If your blood sugar does not go above 70 mg/dL (3.9 mmol/L) after 3 tries, get help right away.  After your blood sugar goes back to normal, eat a meal or a snack within 1 hour.  Treating very low blood sugar If your blood sugar is at or below 54 mg/dL (3 mmol/L), you have very low blood sugar (severe hypoglycemia). This may also cause:  Passing out.  Jerky movements you cannot control (seizure).  Losing consciousness (coma). This is an emergency. Do not wait to see if the symptoms will go away. Get medical help right away. Call your local emergency services (911 in the [place] Do not drive yourself to the hospital. If you have very low blood sugar and you cannot eat or drink, you may need a glucagon shot (injection). A family member or friend should learn how to check your blood sugar and how to give you a glucagon shot. Ask your doctor if you need to have a glucagon shot kit at home. Follow these instructions at home: General instructions  Take over-the-counter and prescription medicines only as told by your doctor.  Stay aware of your blood sugar as told by your doctor.  Limit alcohol intake to no more than 1 drink a day for nonpregnant women and 2 drinks a day for men. One drink equals 12 oz of beer (355 mL), 5 oz of wine (148 mL), or 1 oz of hard liquor (44 mL).  Keep all follow-up visits as told by your doctor. This is important. If you have diabetes:   Follow your diabetes care plan as told by your doctor. Make sure you: ? Know the signs of low blood sugar. ? Take your medicines as told. ? Follow your exercise and meal plan. ? Eat on time. Do not skip meals. ? Check your blood sugar as often as told by your doctor. Always check  it before and after exercise. ? Follow your sick day plan when you cannot eat or drink normally. Make this plan ahead of time  with your doctor.  Share your diabetes care plan with: ? Your work or school. ? People you live with.  Check your pee (urine) for ketones: ? When you are sick. ? As told by your doctor.  Carry a card or wear jewelry that says you have diabetes. Contact a doctor if:  You have trouble keeping your blood sugar in your target range.  You have low blood sugar often. Get help right away if:  You still have symptoms after you eat or drink something sugary.  Your blood sugar is at or below 54 mg/dL (3 mmol/L).  You have jerky movements that you cannot control.  You pass out. These symptoms may be an emergency. Do not wait to see if the symptoms will go away. Get medical help right away. Call your local emergency services (911 in the [place] Do not drive yourself to the hospital. Summary  Hypoglycemia happens when the level of sugar (glucose) in your blood is too low.  Low blood sugar can happen to people who have diabetes and people who do not have diabetes. Low blood sugar can happen quickly, and it can be an emergency.  Make sure you know the signs of low blood sugar and know how to treat it.  Always keep a source of sugar (fast-acting carb) with you to treat low blood sugar. This information is not intended to replace advice given to you by your health care provider. Make sure you discuss any questions you have with your health care provider. Document Revised: 04/12/2018 Document Reviewed: 01/23/2015 Elsevier Patient Education  2020 [employer].

## 2019-12-03 LAB — COMPLETE METABOLIC PANEL WITH GFR
AG Ratio: 1.1 (calc) (ref 1.0–2.5)
ALT: 12 U/L (ref 6–29)
AST: 9 U/L — ABNORMAL LOW (ref 10–35)
Albumin: 4.2 g/dL (ref 3.6–5.1)
Alkaline phosphatase (APISO): 71 U/L (ref 37–153)
BUN: 12 mg/dL (ref 7–25)
CO2: 29 mmol/L (ref 20–32)
Calcium: 10 mg/dL (ref 8.6–10.4)
Chloride: 99 mmol/L (ref 98–110)
Creat: 0.73 mg/dL (ref 0.50–1.05)
GFR, Est African American: 108 mL/min/{1.73_m2} (ref >=60)
GFR, Est Non African American: 93 mL/min/{1.73_m2} (ref >=60)
Globulin: 3.8 g/dL (calc) — ABNORMAL HIGH (ref 1.9–3.7)
Glucose, Bld: 118 mg/dL — ABNORMAL HIGH (ref 65–99)
Potassium: 4.6 mmol/L (ref 3.5–5.3)
Sodium: 139 mmol/L (ref 135–146)
Total Bilirubin: 0.4 mg/dL (ref 0.2–1.2)
Total Protein: 8 g/dL (ref 6.1–8.1)

## 2019-12-03 LAB — LIPID PANEL
Cholesterol: 190 mg/dL (ref <200)
HDL: 50 mg/dL (ref >=50)
LDL Cholesterol (Calc): 115 mg/dL (calc) — ABNORMAL HIGH
Non-HDL Cholesterol (Calc): 140 mg/dL (calc) — ABNORMAL HIGH (ref <130)
Total CHOL/HDL Ratio: 3.8 (calc) (ref <5.0)
Triglycerides: 134 mg/dL (ref <150)

## 2019-12-03 LAB — CBC WITH DIFFERENTIAL/PLATELET
Absolute Monocytes: 816 cells/uL (ref 200–950)
Basophils Absolute: 8 cells/uL (ref 0–200)
Basophils Relative: 0.1 %
Eosinophils Absolute: 216 cells/uL (ref 15–500)
Eosinophils Relative: 2.8 %
HCT: 38.5 % (ref 35.0–45.0)
Hemoglobin: 13.3 g/dL (ref 11.7–15.5)
Lymphs Abs: 2248 cells/uL (ref 850–3900)
MCH: 29.6 pg (ref 27.0–33.0)
MCHC: 34.5 g/dL (ref 32.0–36.0)
MCV: 85.7 fL (ref 80.0–100.0)
MPV: 9.4 fL (ref 7.5–12.5)
Monocytes Relative: 10.6 %
Neutro Abs: 4412 cells/uL (ref 1500–7800)
Neutrophils Relative %: 57.3 %
Platelets: 416 10*3/uL — ABNORMAL HIGH (ref 140–400)
RBC: 4.49 10*6/uL (ref 3.80–5.10)
RDW: 13.6 % (ref 11.0–15.0)
Total Lymphocyte: 29.2 %
WBC: 7.7 10*3/uL (ref 3.8–10.8)

## 2019-12-03 LAB — TSH: TSH: 1.22 mIU/L

## 2019-12-03 LAB — T4, FREE: Free T4: 1.3 ng/dL (ref 0.8–1.8)

## 2019-12-03 LAB — HEMOGLOBIN A1C
Hgb A1c MFr Bld: 6.3 % of total Hgb — ABNORMAL HIGH (ref <5.7)
Mean Plasma Glucose: 134 (calc)
eAG (mmol/L): 7.4 (calc)

## 2019-12-05 ENCOUNTER — Telehealth: Payer: Self-pay

## 2019-12-05 NOTE — Telephone Encounter (Signed)
Pt called for her lab results, already viewed results on MyChart portal, advised pt that the office will call her to review lab results soon as Dr Volanda Napoleon reviews them

## 2019-12-12 NOTE — Progress Notes (Signed)
NEUROLOGY CONSULTATION NOTE  Gail Lloyd MRN: [id number] DOB: 1965-08-15  Referring provider: Sherwood Gambler, MD (ED referral) Primary care provider: Grier Mitts, MD  Reason for consult:  confusion   Subjective:  Gail Lloyd is a 54 year old right-handed female who presents for confusion.  History supplemented by ED and PCP notes.  Got off of work and went grocery shopping.  Got back to truck and started driving home and felt like going to pass out.  Talking to ED and didn't know what happened.  Said had a headache. Not remember.  Crying.   Kept repeating questions 117  Banana and crackers  On 11/27/2019, she started driving home from the grocery store when she became confused.  She was talking to her daughter on the phone when she suddenly felt lightheaded.  She states that she had some water but didn't eat for several hours.  She had a banana and a couple of crackers.  She went to [facility] ED where she didn't know how she got there.  She last remembered being in the grocery store.  The staff had to keep telling her that tomorrow was Thanksgiving.  However, she was able to tell the doctor who she was, where she was and where she worked.  She kept asking about where she left her car.  EKG showed sinus rhythm with no acute findings.  CBC and CMP were unremarkable with a serum glucose of 117.  Yeast noted in the urine and she was started on Diflucan.  MRI of brain without contrast personally reviewed showed very mild chronic small vessel ischemic changes but no acute abnormality.  During her stay, she improved and was discharged in stable condition.   No history of seizures.  No prior history of similar events.  No recurrent episodes since ED visit.   PAST MEDICAL HISTORY: Past Medical History:  Diagnosis Date  . Heart murmur     PAST SURGICAL HISTORY: Past Surgical History:  Procedure Laterality Date  . WISDOM TOOTH EXTRACTION      MEDICATIONS: Current Outpatient  Medications on File Prior to Visit  Medication Sig Dispense Refill  . amLODipine (NORVASC) 5 MG tablet Take 1 tablet (5 mg total) by mouth daily. 90 tablet 3  . Ascorbic Acid (VITAMIN C) 1000 MG tablet Take 1,000 mg by mouth daily.    . carvedilol (COREG) 6.25 MG tablet Take 1 tablet (6.25 mg total) by mouth 2 (two) times daily with a meal. 60 tablet 3  . diphenhydrAMINE (BENADRYL) 25 MG tablet Take 25 mg by mouth every 6 (six) hours as needed for allergies.    . fluticasone (FLONASE) 50 MCG/ACT nasal spray Place 1 spray into both nostrils daily. 16 g 0  . ipratropium (ATROVENT) 0.06 % nasal spray Place 2 sprays into both nostrils 4 (four) times daily. (Patient not taking: Reported on 12/02/2019) 15 mL 0  . melatonin 5 MG TABS Take 5 mg by mouth daily as needed (sleep).    . Multiple Vitamin (MULTIVITAMIN WITH MINERALS) TABS tablet Take 1 tablet by mouth daily.     No current facility-administered medications on file prior to visit.    ALLERGIES: Allergies  Allergen Reactions  . Penicillins Hives and Itching  . Sudafed [Pseudoephedrine Hcl] Palpitations    FAMILY HISTORY: Family History  Problem Relation Age of Onset  . Diabetes Mother   . Hypertension Mother   . Hypertension Father   . Cancer Father    SOCIAL HISTORY: Social History  Socioeconomic History  . Marital status: Single    Spouse name: Not on file  . Number of children: Not on file  . Years of education: Not on file  . Highest education level: Not on file  Occupational History  . Not on file  Tobacco Use  . Smoking status: Never Smoker  . Smokeless tobacco: Never Used  Substance and Sexual Activity  . Alcohol use: No  . Drug use: No  . Sexual activity: Not on file  Other Topics Concern  . Not on file  Social History Narrative  . Not on file   Social Determinants of Health   Financial Resource Strain: Not on file  Food Insecurity: Not on file  Transportation Needs: Not on file  Physical Activity:  Not on file  Stress: Not on file  Social Connections: Not on file  Intimate Partner Violence: Not on file    Objective:  Blood pressure (!) 143/77, pulse 93, height 5\' 7"  (1.702 m), weight 200 lb 12.8 oz (91.1 kg), SpO2 99 %. General: No acute distress.  Patient appears well-groomed.   Head:  Normocephalic/atraumatic Eyes:  fundi examined but not visualized Neck: supple, no paraspinal tenderness, full range of motion Back: No paraspinal tenderness Heart: regular rate and rhythm Lungs: Clear to auscultation bilaterally. Vascular: No carotid bruits. Neurological Exam: Mental status: alert and oriented to person, place, and time, recent and remote memory intact, fund of knowledge intact, attention and concentration intact, speech fluent and not dysarthric, language intact. Cranial nerves: CN I: not tested CN II: pupils equal, round and reactive to light, visual fields intact CN III, IV, VI:  full range of motion, no nystagmus, no ptosis CN V: facial sensation intact. CN VII: upper and lower face symmetric CN VIII: hearing intact CN IX, X: gag intact, uvula midline CN XI: sternocleidomastoid and trapezius muscles intact CN XII: tongue midline Bulk & Tone: normal, no fasciculations. Motor:  muscle strength 5/5 throughout Sensation:  Pinprick, temperature and vibratory sensation intact. Deep Tendon Reflexes:  2+ throughout,  toes downgoing.   Finger to nose testing:  Without dysmetria.   Heel to shin:  Without dysmetria.   Gait:  Normal station and stride.  Romberg negative.  Assessment/Plan:   Probable transient global amnesia  1.  Advised to continue not to drive.  Check EEG.  If normal, she may resume driving 2.  Follow up after testing.    Thank you for allowing me to take part in the care of this patient.  Metta Clines, DO  CC:  Grier Mitts, MD

## 2019-12-13 ENCOUNTER — Encounter: Payer: Self-pay | Admitting: Neurology

## 2019-12-13 ENCOUNTER — Ambulatory Visit (INDEPENDENT_AMBULATORY_CARE_PROVIDER_SITE_OTHER): Payer: 59 | Admitting: Neurology

## 2019-12-13 ENCOUNTER — Other Ambulatory Visit: Payer: Self-pay

## 2019-12-13 VITALS — BP 143/77 | HR 93 | Ht 67.0 in | Wt 200.8 lb

## 2019-12-13 DIAGNOSIS — G454 Transient global amnesia: Secondary | ICD-10-CM | POA: Diagnosis not present

## 2019-12-13 DIAGNOSIS — R413 Other amnesia: Secondary | ICD-10-CM | POA: Diagnosis not present

## 2019-12-13 NOTE — Patient Instructions (Signed)
We will check an EEG.  If normal, then you may resume driving. Follow up after testing   Transient Global Amnesia Transient global amnesia causes a sudden and temporary (transient) loss of memory (amnesia). You may recall memories from your distant past and people you know well. However, you may not recall things that happened more recently in the past days, months, or even year. A transient global amnesia episode does not last longer than 24 hours. Transient global amnesia does not affect your other brain functions. Your memory usually returns to normal after an episode is over. One episode of transient global amnesia does not make you more likely to have a stroke, a relapse, or other complications. What are the causes? The cause of this condition is not known. Certain activities have been reported to trigger transient global amnesia. These activities include:  Swimming in very cold or hot water.  Sexual intercourse.  Emotional distress, such as receiving bad news or having a lot of stress at once.  Strenuous exercise or activity. What increases the risk? You are more likely to develop this condition if:  You are 57-4 years old.  You have a history of migraine headaches. What are the signs or symptoms? The main symptoms of this condition include:  Being unable to remember recent events.  Asking repetitive questions about a situation and surroundings and not recalling the answers to these questions. Other symptoms include:  Restlessness and nervousness.  Confusion.  Headaches.  Dizziness.  Nausea. How is this diagnosed? This condition may be diagnosed based on:  Your symptoms.  A physical exam.  A test to check your mental abilities (cognitive evaluation).  Imaging studies to check brain function. These may include: ? Electroencephalogram (EEG). This test checks the brain's electrical activity. ? CT scan. ? MRI. How is this treated? There is no treatment for  this condition. An episode typically goes away on its own after a few hours. You may receive medicines to treat other conditions, such as a migraine. Follow these instructions at home:  Take over-the-counter and prescription medicines only as told by your health care provider.  Avoid taking medicines that can affect thinking, such as pain or sleeping medicines.  Learn what activities may trigger an episode. Avoid these activities as told by your health care provider.  Find ways to manage stress, such as meditation or yoga.  Keep all follow-up visits as told by your health care provider. This is important. Contact a health care provider if you:  Have a migraine that does not go away.  Experience transient global amnesia repeatedly. Get help right away if you:  Have a seizure. Summary  Transient global amnesia causes a sudden and temporary (transient) loss of memory (amnesia).  There is no treatment for this condition. An episode typically goes away on its own after a few hours.  You may receive medicines to treat other conditions, such as a migraine.  Transient global amnesia does not affect your other brain functions. Your memory usually returns to normal after an episode is over. This information is not intended to replace advice given to you by your health care provider. Make sure you discuss any questions you have with your health care provider. Document Revised: 01/04/2017 Document Reviewed: 01/04/2017 Elsevier Patient Education  2020 [employer].

## 2019-12-16 ENCOUNTER — Ambulatory Visit (INDEPENDENT_AMBULATORY_CARE_PROVIDER_SITE_OTHER): Payer: 59 | Admitting: Neurology

## 2019-12-16 ENCOUNTER — Other Ambulatory Visit: Payer: Self-pay

## 2019-12-16 DIAGNOSIS — R413 Other amnesia: Secondary | ICD-10-CM | POA: Diagnosis not present

## 2019-12-17 ENCOUNTER — Telehealth: Payer: Self-pay

## 2019-12-17 NOTE — Telephone Encounter (Signed)
Called patient and informed her of normal results. Patient was very happy and will be here for her appt on Friday.

## 2019-12-17 NOTE — Telephone Encounter (Signed)
-----   Message from Pieter Partridge, DO sent at 12/17/2019 12:30 PM EST ----- EEG is normal.  She may resume driving.

## 2019-12-17 NOTE — Procedures (Signed)
ELECTROENCEPHALOGRAM REPORT  Date of Study: 12/16/2019  Patient's Name: Gail Lloyd MRN: [id number] Date of Birth: [birth year]   Clinical History: 53 year old female with probable episode of transient global amnesia  Medications: NORVASC 5 MG tablet VITAMIN C 1000 MG tablet COREG 6.25 MG tablet BENADRYL 25 MG tablet FLONASE 50 MCG/ACT nasal spray melatonin 5 MG TABS MULTIVITAMIN WITH MINERALS TABS tablet  Technical Summary: A multichannel digital EEG recording measured by the international 10-20 system with electrodes applied with paste and impedances below 5000 ohms performed in our laboratory with EKG monitoring in an awake and drowsy patient.  Hyperventilation not performed as patient wearing face mask due to the COVID-19 pandemic.  Photic stimulation was performed.  The digital EEG was referentially recorded, reformatted, and digitally filtered in a variety of bipolar and referential montages for optimal display.    Description: The patient is awake and drowsy during the recording.  During maximal wakefulness, there is a symmetric, medium voltage 9-10 Hz posterior dominant rhythm that attenuates with eye opening.  The record is symmetric.  During drowsiness, there is an increase in theta slowing of the background.  Stage 2 sleep was not seen.  Photic stimulation did not elicit any abnormalities.  There were no epileptiform discharges or electrographic seizures seen.    EKG lead was unremarkable.  Impression: This awake and drowsy EEG is normal.    Clinical Correlation: A normal EEG does not exclude a clinical diagnosis of epilepsy.  If further clinical questions remain, prolonged EEG may be helpful.  Clinical correlation is advised.   Metta Clines, DO

## 2019-12-18 NOTE — Progress Notes (Signed)
NEUROLOGY FOLLOW UP OFFICE NOTE  Dreonna Hussein [id number]   Subjective:  Gail Lloyd is a 54 year old right-handed female who follows up for transient global amnesia.  UPDATE: EEG on 12/16/2019 was normal. She is feeling well.  No recurrent spells.  HISTORY: On 11/27/2019, she started driving home from the grocery store when she became confused.  She was talking to her daughter on the phone when she suddenly felt lightheaded.  She states that she had some water but didn't eat for several hours.  She had a banana and a couple of crackers.  She went to [facility] ED where she didn't know how she got there.  She last remembered being in the grocery store.  The staff had to keep telling her that tomorrow was Thanksgiving.  However, she was able to tell the doctor who she was, where she was and where she worked.  She kept asking about where she left her car.  EKG showed sinus rhythm with no acute findings.  CBC and CMP were unremarkable with a serum glucose of 117.  Yeast noted in the urine and she was started on Diflucan.  MRI of brain without contrast personally reviewed showed very mild chronic small vessel ischemic changes but no acute abnormality.  During her stay, she improved and was discharged in stable condition.   No history of seizures.  No prior history of similar events.  No recurrent episodes since ED visit.  PAST MEDICAL HISTORY: Past Medical History:  Diagnosis Date   Heart murmur     MEDICATIONS: Current Outpatient Medications on File Prior to Visit  Medication Sig Dispense Refill   amLODipine (NORVASC) 5 MG tablet Take 1 tablet (5 mg total) by mouth daily. 90 tablet 3   Ascorbic Acid (VITAMIN C) 1000 MG tablet Take 1,000 mg by mouth daily.     carvedilol (COREG) 6.25 MG tablet Take 1 tablet (6.25 mg total) by mouth 2 (two) times daily with a meal. 60 tablet 3   diphenhydrAMINE (BENADRYL) 25 MG tablet Take 25 mg by mouth every 6 (six) hours as needed for  allergies.     fluticasone (FLONASE) 50 MCG/ACT nasal spray Place 1 spray into both nostrils daily. 16 g 0   ipratropium (ATROVENT) 0.06 % nasal spray Place 2 sprays into both nostrils 4 (four) times daily. (Patient not taking: No sig reported) 15 mL 0   melatonin 5 MG TABS Take 5 mg by mouth daily as needed (sleep).     Multiple Vitamin (MULTIVITAMIN WITH MINERALS) TABS tablet Take 1 tablet by mouth daily.     No current facility-administered medications on file prior to visit.    ALLERGIES: Allergies  Allergen Reactions   Penicillins Hives and Itching   Sudafed [Pseudoephedrine Hcl] Palpitations    FAMILY HISTORY: Family History  Problem Relation Age of Onset   Diabetes Mother    Hypertension Mother    Hypertension Father    Cancer Father     SOCIAL HISTORY: Social History   Socioeconomic History   Marital status: Single    Spouse name: Not on file   Number of children: Not on file   Years of education: Not on file   Highest education level: Not on file  Occupational History   Not on file  Tobacco Use   Smoking status: Never Smoker   Smokeless tobacco: Never Used  Substance and Sexual Activity   Alcohol use: No   Drug use: No   Sexual activity: Not  on file  Other Topics Concern   Not on file  Social History Narrative   Not on file   Social Determinants of Health   Financial Resource Strain: Not on file  Food Insecurity: Not on file  Transportation Needs: Not on file  Physical Activity: Not on file  Stress: Not on file  Social Connections: Not on file  Intimate Partner Violence: Not on file     Objective:  Blood pressure (!) 144/85, pulse 88, height 5\' 7"  (1.702 m), weight 202 lb 3.2 oz (91.7 kg), SpO2 100 %. General: No acute distress.  Patient appears well-groomed.     Assessment/Plan:   Transient global amnesia  1.  May resume driving 2.  No further neurologic workup 3.  Follow up as needed.  Gail Clines, DO  CC:  Gail Mitts, MD

## 2019-12-20 ENCOUNTER — Other Ambulatory Visit: Payer: Self-pay

## 2019-12-20 ENCOUNTER — Ambulatory Visit (INDEPENDENT_AMBULATORY_CARE_PROVIDER_SITE_OTHER): Payer: 59 | Admitting: Neurology

## 2019-12-20 ENCOUNTER — Encounter: Payer: Self-pay | Admitting: Neurology

## 2019-12-20 VITALS — BP 144/85 | HR 88 | Ht 67.0 in | Wt 202.2 lb

## 2019-12-20 DIAGNOSIS — G454 Transient global amnesia: Secondary | ICD-10-CM

## 2019-12-20 NOTE — Patient Instructions (Signed)
May resume driving Follow up as needed

## 2019-12-21 ENCOUNTER — Emergency Department (HOSPITAL_COMMUNITY)
Admission: EM | Admit: 2019-12-21 | Discharge: 2019-12-22 | Disposition: A | Discharge status: Home / Self Care | Payer: 59 | Attending: Emergency Medicine | Admitting: Emergency Medicine

## 2019-12-21 ENCOUNTER — Other Ambulatory Visit: Payer: Self-pay

## 2019-12-21 DIAGNOSIS — Z79899 Other long term (current) drug therapy: Secondary | ICD-10-CM | POA: Diagnosis not present

## 2019-12-21 DIAGNOSIS — I10 Essential (primary) hypertension: Secondary | ICD-10-CM | POA: Diagnosis not present

## 2019-12-21 DIAGNOSIS — R030 Elevated blood-pressure reading, without diagnosis of hypertension: Secondary | ICD-10-CM | POA: Diagnosis present

## 2019-12-21 LAB — BASIC METABOLIC PANEL
Anion gap: 14 (ref 5–15)
BUN: 11 mg/dL (ref 6–20)
CO2: 21 mmol/L — ABNORMAL LOW (ref 22–32)
Calcium: 9.5 mg/dL (ref 8.9–10.3)
Chloride: 102 mmol/L (ref 98–111)
Creatinine, Ser: 0.87 mg/dL (ref 0.44–1.00)
GFR, Estimated: >60 mL/min (ref >60)
Glucose, Bld: 172 mg/dL — ABNORMAL HIGH (ref 70–99)
Potassium: 3.3 mmol/L — ABNORMAL LOW (ref 3.5–5.1)
Sodium: 137 mmol/L (ref 135–145)

## 2019-12-21 LAB — CBC
HCT: 34.9 % — ABNORMAL LOW (ref 36.0–46.0)
Hemoglobin: 11.6 g/dL — ABNORMAL LOW (ref 12.0–15.0)
MCH: 29.7 pg (ref 26.0–34.0)
MCHC: 33.2 g/dL (ref 30.0–36.0)
MCV: 89.5 fL (ref 80.0–100.0)
Platelets: 363 10*3/uL (ref 150–400)
RBC: 3.9 MIL/uL (ref 3.87–5.11)
RDW: 13.6 % (ref 11.5–15.5)
WBC: 8.9 10*3/uL (ref 4.0–10.5)
nRBC: 0 % (ref 0.0–0.2)

## 2019-12-21 NOTE — ED Triage Notes (Signed)
Pt presents to ED POV. Pt c/o HTN. Fire department came to home and bp was 230/110. Pt reports feeling dizzy during that episode. Pt has no complaints currently. Pt recently started on bp meds and has been compliant

## 2019-12-22 NOTE — ED Notes (Signed)
Patient given discharge instructions. Questions were answered. Patient verbalized understanding of discharge instructions and care at home.  

## 2019-12-22 NOTE — ED Provider Notes (Signed)
[facility] EMERGENCY DEPARTMENT Provider Note   CSN: [id number] Arrival date & time: 12/21/19  2029     History Chief Complaint  Patient presents with  . Hypertension    Gail Lloyd is a 54 y.o. female.  HPI Patient presents with hypertension.  States yesterday afternoon she was feeling dizzy.  States she felt that she could pass out.  States that her blood pressure was checked and it was 230/110.  Feeling better now.  Had an approximate 11-hour wait from checking into the ER to being seen by me.  No headache.  No confusion.  Somewhat recently been diagnosed with hypertension and is on Norvasc and Coreg.  No chest pain.  Dizziness is resolved.  No swelling in her legs.  States she checked her blood pressure cuff at home and it was elevated but when EMS got there it was not as high.  States she has had good measurements with her cough in the past.    Past Medical History:  Diagnosis Date  . Heart murmur     Patient Active Problem List   Diagnosis Date Noted  . Global amnesia 12/13/2019  . Prediabetes 10/10/2019  . Essential hypertension 10/10/2019  . Snores 10/10/2019    Past Surgical History:  Procedure Laterality Date  . WISDOM TOOTH EXTRACTION       OB History    Gravida  1   Para      Term      Preterm      AB      Living  1     SAB      IAB      Ectopic      Multiple      Live Births              Family History  Problem Relation Age of Onset  . Diabetes Mother   . Hypertension Mother   . Hypertension Father   . Cancer Father     Social History   Tobacco Use  . Smoking status: Never Smoker  . Smokeless tobacco: Never Used  Substance Use Topics  . Alcohol use: No  . Drug use: No    Home Medications Prior to Admission medications   Medication Sig Start Date End Date Taking? Authorizing Provider  amLODipine (NORVASC) 5 MG tablet Take 1 tablet (5 mg total) by mouth daily. 01/30/19   Burchette, Alinda Sierras, MD   Ascorbic Acid (VITAMIN C) 1000 MG tablet Take 1,000 mg by mouth daily.    [provider]  carvedilol (COREG) 6.25 MG tablet Take 1 tablet (6.25 mg total) by mouth 2 (two) times daily with a meal. 10/04/19   Billie Ruddy, MD  diphenhydrAMINE (BENADRYL) 25 MG tablet Take 25 mg by mouth every 6 (six) hours as needed for allergies.    [provider]  fluticasone (FLONASE) 50 MCG/ACT nasal spray Place 1 spray into both nostrils daily. 12/02/19   Billie Ruddy, MD  ipratropium (ATROVENT) 0.06 % nasal spray Place 2 sprays into both nostrils 4 (four) times daily. 02/23/17   Tasia Catchings, Amy V, PA-C  melatonin 5 MG TABS Take 5 mg by mouth daily as needed (sleep).    [provider]  Multiple Vitamin (MULTIVITAMIN WITH MINERALS) TABS tablet Take 1 tablet by mouth daily.    [provider]    Allergies    Penicillins and Sudafed [pseudoephedrine hcl]  Review of Systems   Review of Systems  Constitutional:  Negative for appetite change.  HENT: Negative for congestion.   Respiratory: Negative for shortness of breath.   Cardiovascular: Negative for chest pain.  Gastrointestinal: Negative for abdominal pain.  Genitourinary: Negative for flank pain.  Musculoskeletal: Negative for back pain.  Skin: Negative for rash.  Neurological: Positive for light-headedness.  Psychiatric/Behavioral: Negative for confusion.    Physical Exam Updated Vital Signs BP (!) 142/83 (BP Location: Right Arm)   Pulse 84   Temp 98.4 F (36.9 C) (Oral)   Resp 18   SpO2 100%   Physical Exam Vitals and nursing note reviewed.  HENT:     Head: Atraumatic.     Mouth/Throat:     Mouth: Mucous membranes are moist.  Eyes:     Extraocular Movements: Extraocular movements intact.     Pupils: Pupils are equal, round, and reactive to light.  Cardiovascular:     Rate and Rhythm: Regular rhythm.  Pulmonary:     Effort: No respiratory distress.     Breath sounds: No wheezing or rhonchi.   Abdominal:     Tenderness: There is no abdominal tenderness.  Musculoskeletal:        General: No tenderness.     Cervical back: Neck supple.  Skin:    General: Skin is warm.     Capillary Refill: Capillary refill takes less than 2 seconds.  Neurological:     Mental Status: She is alert and oriented to person, place, and time.     ED Results / Procedures / Treatments   Labs (all labs ordered are listed, but only abnormal results are displayed) Labs Reviewed  CBC - Abnormal; Notable for the following components:      Result Value   Hemoglobin 11.6 (*)    HCT 34.9 (*)    All other components within normal limits  BASIC METABOLIC PANEL - Abnormal; Notable for the following components:   Potassium 3.3 (*)    CO2 21 (*)    Glucose, Bld 172 (*)    All other components within normal limits    EKG None  Radiology No results found.  Procedures Procedures (including critical care time)  Medications Ordered in ED Medications - No data to display  ED Course  I have reviewed the triage vital signs and the nursing notes.  Pertinent labs & imaging results that were available during my care of the patient were reviewed by me and considered in my medical decision making (see chart for details).    MDM Rules/Calculators/A&P                          Patient with dizziness and hypertension.  Feeling better now.  Reported was hypertensive at the time although was on her own blood pressure cuff at home and reported to EMS a different measurement.  However feeling better now.  No lateralizing numbness or weakness at the time.  No chest pain.  Blood pressure improved.  Recently started on antihypertensives.  At this point does not appear to be hypertensive urgency or emergency.  Will take her medicines and will keep a log and follow-up with her doctor.  Do not see endorgan damage at this time. I have reviewed and interpreted EKG, and blood work.  Discharge home.   Final Clinical  Impression(s) / ED Diagnoses Final diagnoses:  Hypertension, unspecified type    Rx / DC Orders ED Discharge Orders    None       Davonna Belling,  MD 12/22/19 [phone]

## 2019-12-24 ENCOUNTER — Ambulatory Visit: Payer: 59 | Admitting: Family Medicine

## 2019-12-24 ENCOUNTER — Encounter: Payer: Self-pay | Admitting: Family Medicine

## 2019-12-24 ENCOUNTER — Other Ambulatory Visit: Payer: Self-pay

## 2019-12-24 VITALS — BP 142/82 | HR 92 | Ht 67.0 in | Wt 197.0 lb

## 2019-12-24 DIAGNOSIS — E876 Hypokalemia: Secondary | ICD-10-CM

## 2019-12-24 DIAGNOSIS — I10 Essential (primary) hypertension: Secondary | ICD-10-CM | POA: Diagnosis not present

## 2019-12-24 DIAGNOSIS — R5383 Other fatigue: Secondary | ICD-10-CM

## 2019-12-24 MED ORDER — LOSARTAN POTASSIUM 50 MG PO TABS: 50.0000 mg | ORAL_TABLET | Freq: Every day | ORAL | 3 refills | Status: DC

## 2019-12-24 NOTE — Patient Instructions (Signed)
DASH Eating Plan DASH stands for "Dietary Approaches to Stop Hypertension." The DASH eating plan is a healthy eating plan that has been shown to reduce high blood pressure (hypertension). It may also reduce your risk for type 2 diabetes, heart disease, and stroke. The DASH eating plan may also help with weight loss. What are tips for following this plan?  General guidelines  Avoid eating more than 2,300 mg (milligrams) of salt (sodium) a day. If you have hypertension, you may need to reduce your sodium intake to 1,500 mg a day.  Limit alcohol intake to no more than 1 drink a day for nonpregnant women and 2 drinks a day for men. One drink equals 12 oz of beer, 5 oz of wine, or 1 oz of hard liquor.  Work with your health care provider to maintain a healthy body weight or to lose weight. Ask what an ideal weight is for you.  Get at least 30 minutes of exercise that causes your heart to beat faster (aerobic exercise) most days of the week. Activities may include walking, swimming, or biking.  Work with your health care provider or diet and nutrition specialist (dietitian) to adjust your eating plan to your individual calorie needs. Reading food labels   Check food labels for the amount of sodium per serving. Choose foods with less than 5 percent of the Daily Value of sodium. Generally, foods with less than 300 mg of sodium per serving fit into this eating plan.  To find whole grains, look for the word "whole" as the first word in the ingredient list. Shopping  Buy products labeled as "low-sodium" or "no salt added."  Buy fresh foods. Avoid canned foods and premade or frozen meals. Cooking  Avoid adding salt when cooking. Use salt-free seasonings or herbs instead of table salt or sea salt. Check with your health care provider or pharmacist before using salt substitutes.  Do not fry foods. Cook foods using healthy methods such as baking, boiling, grilling, and broiling instead.  Cook with  heart-healthy oils, such as olive, canola, soybean, or sunflower oil. Meal planning  Eat a balanced diet that includes: ? 5 or more servings of fruits and vegetables each day. At each meal, try to fill half of your plate with fruits and vegetables. ? Up to 6-8 servings of whole grains each day. ? Less than 6 oz of lean meat, poultry, or fish each day. A 3-oz serving of meat is about the same size as a deck of cards. One egg equals 1 oz. ? 2 servings of low-fat dairy each day. ? A serving of nuts, seeds, or beans 5 times each week. ? Heart-healthy fats. Healthy fats called Omega-3 fatty acids are found in foods such as flaxseeds and coldwater fish, like sardines, salmon, and mackerel.  Limit how much you eat of the following: ? Canned or prepackaged foods. ? Food that is high in trans fat, such as fried foods. ? Food that is high in saturated fat, such as fatty meat. ? Sweets, desserts, sugary drinks, and other foods with added sugar. ? Full-fat dairy products.  Do not salt foods before eating.  Try to eat at least 2 vegetarian meals each week.  Eat more home-cooked food and less restaurant, buffet, and fast food.  When eating at a restaurant, ask that your food be prepared with less salt or no salt, if possible. What foods are recommended? The items listed may not be a complete list. Talk with your dietitian about   what dietary choices are best for you. Grains Whole-grain or whole-wheat bread. Whole-grain or whole-wheat pasta. Brown rice. Oatmeal. Quinoa. Bulgur. Whole-grain and low-sodium cereals. Pita bread. Low-fat, low-sodium crackers. Whole-wheat flour tortillas. Vegetables Fresh or frozen vegetables (raw, steamed, roasted, or grilled). Low-sodium or reduced-sodium tomato and vegetable juice. Low-sodium or reduced-sodium tomato sauce and tomato paste. Low-sodium or reduced-sodium canned vegetables. Fruits All fresh, dried, or frozen fruit. Canned fruit in natural juice (without  added sugar). Meat and other protein foods Skinless chicken or turkey. Ground chicken or turkey. Pork with fat trimmed off. Fish and seafood. Egg whites. Dried beans, peas, or lentils. Unsalted nuts, nut butters, and seeds. Unsalted canned beans. Lean cuts of beef with fat trimmed off. Low-sodium, lean deli meat. Dairy Low-fat (1%) or fat-free (skim) milk. Fat-free, low-fat, or reduced-fat cheeses. Nonfat, low-sodium ricotta or cottage cheese. Low-fat or nonfat yogurt. Low-fat, low-sodium cheese. Fats and oils Soft margarine without trans fats. Vegetable oil. Low-fat, reduced-fat, or light mayonnaise and salad dressings (reduced-sodium). Canola, safflower, olive, soybean, and sunflower oils. Avocado. Seasoning and other foods Herbs. Spices. Seasoning mixes without salt. Unsalted popcorn and pretzels. Fat-free sweets. What foods are not recommended? The items listed may not be a complete list. Talk with your dietitian about what dietary choices are best for you. Grains Baked goods made with fat, such as croissants, muffins, or some breads. Dry pasta or rice meal packs. Vegetables Creamed or fried vegetables. Vegetables in a cheese sauce. Regular canned vegetables (not low-sodium or reduced-sodium). Regular canned tomato sauce and paste (not low-sodium or reduced-sodium). Regular tomato and vegetable juice (not low-sodium or reduced-sodium). Pickles. Olives. Fruits Canned fruit in a light or heavy syrup. Fried fruit. Fruit in cream or butter sauce. Meat and other protein foods Fatty cuts of meat. Ribs. Fried meat. Bacon. Sausage. Bologna and other processed lunch meats. Salami. Fatback. Hotdogs. Bratwurst. Salted nuts and seeds. Canned beans with added salt. Canned or smoked fish. Whole eggs or egg yolks. Chicken or turkey with skin. Dairy Whole or 2% milk, cream, and half-and-half. Whole or full-fat cream cheese. Whole-fat or sweetened yogurt. Full-fat cheese. Nondairy creamers. Whipped toppings.  Processed cheese and cheese spreads. Fats and oils Butter. Stick margarine. Lard. Shortening. Ghee. Bacon fat. Tropical oils, such as coconut, palm kernel, or palm oil. Seasoning and other foods Salted popcorn and pretzels. Onion salt, garlic salt, seasoned salt, table salt, and sea salt. Worcestershire sauce. Tartar sauce. Barbecue sauce. Teriyaki sauce. Soy sauce, including reduced-sodium. Steak sauce. Canned and packaged gravies. Fish sauce. Oyster sauce. Cocktail sauce. Horseradish that you find on the shelf. Ketchup. Mustard. Meat flavorings and tenderizers. Bouillon cubes. Hot sauce and Tabasco sauce. Premade or packaged marinades. Premade or packaged taco seasonings. Relishes. Regular salad dressings. Where to find more information:  National Heart, Lung, and Blood Institute: www.nhlbi.nih.gov  American Heart Association: www.heart.org Summary  The DASH eating plan is a healthy eating plan that has been shown to reduce high blood pressure (hypertension). It may also reduce your risk for type 2 diabetes, heart disease, and stroke.  With the DASH eating plan, you should limit salt (sodium) intake to 2,300 mg a day. If you have hypertension, you may need to reduce your sodium intake to 1,500 mg a day.  When on the DASH eating plan, aim to eat more fresh fruits and vegetables, whole grains, lean proteins, low-fat dairy, and heart-healthy fats.  Work with your health care provider or diet and nutrition specialist (dietitian) to adjust your eating plan to your   individual calorie needs. This information is not intended to replace advice given to you by your health care provider. Make sure you discuss any questions you have with your health care provider. Document Revised: 12/02/2016 Document Reviewed: 12/14/2015 Elsevier Patient Education  2020 [employer]  

## 2019-12-24 NOTE — Progress Notes (Signed)
Established Patient Office Visit  Subjective:  Patient ID: Gail Lloyd, female    DOB: Jun 01, 1965  Age: 54 y.o. MRN: [id number]  CC:  Chief Complaint  Patient presents with  . Hypertension    HPI Gail Lloyd presents for hypertension evaluation.  She takes amlodipine 5 mg daily at baseline and compliant with therapy.  She recently felt poorly in terms of some fatigue and dizziness.  She had 1 home reading of 230/110 which prompted ER evaluation on 12/21/19 .  EKG was unremarkable.  Labs unremarkable except for potassium 3.3.  Her blood pressure in the ER was 142/83.  She denies any headaches at this time.  Patient also takes carvedilol 6.25 mg twice daily.  No alcohol use.  Did consume some increased sodium recently.  She had a dish that her daughter made that had some soy sauce.  She thinks that may have run the blood pressure up when she went to the ER.  She feels better at this time.  She brings in a log of home readings and has had several readings over the past several days over 989 systolic.  No ETOH use.    Past Medical History:  Diagnosis Date  . Heart murmur     Past Surgical History:  Procedure Laterality Date  . WISDOM TOOTH EXTRACTION      Family History  Problem Relation Age of Onset  . Diabetes Mother   . Hypertension Mother   . Hypertension Father   . Cancer Father     Social History   Socioeconomic History  . Marital status: Single    Spouse name: Not on file  . Number of children: Not on file  . Years of education: Not on file  . Highest education level: Not on file  Occupational History  . Not on file  Tobacco Use  . Smoking status: Never Smoker  . Smokeless tobacco: Never Used  Substance and Sexual Activity  . Alcohol use: No  . Drug use: No  . Sexual activity: Not on file  Other Topics Concern  . Not on file  Social History Narrative  . Not on file   Social Determinants of Health   Financial Resource Strain: Not on file  Food  Insecurity: Not on file  Transportation Needs: Not on file  Physical Activity: Not on file  Stress: Not on file  Social Connections: Not on file  Intimate Partner Violence: Not on file    Outpatient Medications Prior to Visit  Medication Sig Dispense Refill  . amLODipine (NORVASC) 5 MG tablet Take 1 tablet (5 mg total) by mouth daily. 90 tablet 3  . Ascorbic Acid (VITAMIN C) 1000 MG tablet Take 1,000 mg by mouth daily.    . carvedilol (COREG) 6.25 MG tablet Take 1 tablet (6.25 mg total) by mouth 2 (two) times daily with a meal. 60 tablet 3  . diphenhydrAMINE (BENADRYL) 25 MG tablet Take 25 mg by mouth every 6 (six) hours as needed for allergies.    . fluticasone (FLONASE) 50 MCG/ACT nasal spray Place 1 spray into both nostrils daily. 16 g 0  . ipratropium (ATROVENT) 0.06 % nasal spray Place 2 sprays into both nostrils 4 (four) times daily. 15 mL 0  . melatonin 5 MG TABS Take 5 mg by mouth daily as needed (sleep).    . Multiple Vitamin (MULTIVITAMIN WITH MINERALS) TABS tablet Take 1 tablet by mouth daily.     No facility-administered medications prior to visit.  Allergies  Allergen Reactions  . Penicillins Hives and Itching  . Sudafed [Pseudoephedrine Hcl] Palpitations    ROS Review of Systems  Constitutional: Positive for fatigue. Negative for chills and unexpected weight change.  Respiratory: Negative for cough and shortness of breath.   Cardiovascular: Negative for chest pain and leg swelling.  Genitourinary: Negative for dysuria.  Neurological: Positive for light-headedness. Negative for headaches.      Objective:    Physical Exam Vitals reviewed.  Constitutional:      Appearance: Normal appearance.  Cardiovascular:     Rate and Rhythm: Normal rate and regular rhythm.  Pulmonary:     Effort: Pulmonary effort is normal.     Breath sounds: Normal breath sounds.  Musculoskeletal:     Right lower leg: No edema.     Left lower leg: No edema.  Neurological:      General: No focal deficit present.     Mental Status: She is alert.     Cranial Nerves: No cranial nerve deficit.     BP (!) 142/82 (BP Location: Left Arm, Cuff Size: Normal)   Pulse 92   Ht 5\' 7"  (1.702 m)   Wt 197 lb (89.4 kg)   SpO2 98%   BMI 30.85 kg/m  Wt Readings from Last 3 Encounters:  12/24/19 197 lb (89.4 kg)  12/20/19 202 lb 3.2 oz (91.7 kg)  12/13/19 200 lb 12.8 oz (91.1 kg)     Health Maintenance Due  Topic Date Due  . Hepatitis C Screening  Never done  . HIV Screening  Never done  . TETANUS/TDAP  Never done  . PAP SMEAR-Modifier  Never done  . COLONOSCOPY  Never done  . COVID-19 Vaccine (3 - Booster for Pfizer series) 09/23/2019    There are no preventive care reminders to display for this patient.  Lab Results  Component Value Date   TSH 1.22 12/02/2019   Lab Results  Component Value Date   WBC 8.9 12/21/2019   HGB 11.6 (L) 12/21/2019   HCT 34.9 (L) 12/21/2019   MCV 89.5 12/21/2019   PLT 363 12/21/2019   Lab Results  Component Value Date   NA 137 12/21/2019   K 3.3 (L) 12/21/2019   CO2 21 (L) 12/21/2019   GLUCOSE 172 (H) 12/21/2019   BUN 11 12/21/2019   CREATININE 0.87 12/21/2019   BILITOT 0.4 12/02/2019   ALKPHOS 67 11/27/2019   AST 9 (L) 12/02/2019   ALT 12 12/02/2019   PROT 8.0 12/02/2019   ALBUMIN 4.1 11/27/2019   CALCIUM 9.5 12/21/2019   ANIONGAP 14 12/21/2019   Lab Results  Component Value Date   CHOL 190 12/02/2019   Lab Results  Component Value Date   HDL 50 12/02/2019   Lab Results  Component Value Date   LDLCALC 115 (H) 12/02/2019   Lab Results  Component Value Date   TRIG 134 12/02/2019   Lab Results  Component Value Date   CHOLHDL 3.8 12/02/2019   Lab Results  Component Value Date   HGBA1C 6.3 (H) 12/02/2019      Assessment & Plan:   Hypertension.  Poorly controlled by several recent elevated home readings.  Patient already on amlodipine currently and carvedilol  -Follow low-sodium diet.  Handout on  DASH diet given -Increase natural sources of potassium with recent potassium 3.3 -Consider addition of losartan 50 mg daily and recommend follow-up with Dr. Volanda Napoleon in 8 days to reassess.  Consider basic metabolic panel repeated at time -We did  discuss other potential options for treating her blood pressure including thiazides or Aldactone but with recent low potassium decided HCTZ was probably not the best choice.  Meds ordered this encounter  Medications  . losartan (COZAAR) 50 MG tablet    Sig: Take 1 tablet (50 mg total) by mouth daily.    Dispense:  30 tablet    Refill:  3    Follow-up: No follow-ups on file.    Carolann Littler, MD

## 2019-12-31 ENCOUNTER — Other Ambulatory Visit: Payer: Self-pay

## 2020-01-01 ENCOUNTER — Encounter: Payer: Self-pay | Admitting: Family Medicine

## 2020-01-01 ENCOUNTER — Other Ambulatory Visit: Payer: Self-pay

## 2020-01-01 ENCOUNTER — Other Ambulatory Visit: Payer: Self-pay | Admitting: Family Medicine

## 2020-01-01 ENCOUNTER — Ambulatory Visit: Payer: 59 | Admitting: Family Medicine

## 2020-01-01 VITALS — BP 120/78 | HR 83 | Temp 99.1°F | Wt 197.2 lb

## 2020-01-01 DIAGNOSIS — F419 Anxiety disorder, unspecified: Secondary | ICD-10-CM | POA: Diagnosis not present

## 2020-01-01 DIAGNOSIS — J302 Other seasonal allergic rhinitis: Secondary | ICD-10-CM

## 2020-01-01 DIAGNOSIS — E876 Hypokalemia: Secondary | ICD-10-CM | POA: Diagnosis not present

## 2020-01-01 DIAGNOSIS — I10 Essential (primary) hypertension: Secondary | ICD-10-CM

## 2020-01-01 DIAGNOSIS — F321 Major depressive disorder, single episode, moderate: Secondary | ICD-10-CM | POA: Diagnosis not present

## 2020-01-01 LAB — BASIC METABOLIC PANEL
BUN: 12 mg/dL (ref 6–23)
CO2: 29 mEq/L (ref 19–32)
Calcium: 9.5 mg/dL (ref 8.4–10.5)
Chloride: 100 mEq/L (ref 96–112)
Creatinine, Ser: 0.77 mg/dL (ref 0.40–1.20)
GFR: 87.11 mL/min (ref >60.00)
Glucose, Bld: 96 mg/dL (ref 70–99)
Potassium: 4.2 mEq/L (ref 3.5–5.1)
Sodium: 137 mEq/L (ref 135–145)

## 2020-01-01 NOTE — Patient Instructions (Addendum)
Managing Your Hypertension Hypertension is commonly called high blood pressure. This is when the force of your blood pressing against the walls of your arteries is too strong. Arteries are blood vessels that carry blood from your heart throughout your body. Hypertension forces the heart to work harder to pump blood, and may cause the arteries to become narrow or stiff. Having untreated or uncontrolled hypertension can cause heart attack, stroke, kidney disease, and other problems. What are blood pressure readings? A blood pressure reading consists of a higher number over a lower number. Ideally, your blood pressure should be below 120/80. The first ("top") number is called the systolic pressure. It is a measure of the pressure in your arteries as your heart beats. The second ("bottom") number is called the diastolic pressure. It is a measure of the pressure in your arteries as the heart relaxes. What does my blood pressure reading mean? Blood pressure is classified into four stages. Based on your blood pressure reading, your health care provider may use the following stages to determine what type of treatment you need, if any. Systolic pressure and diastolic pressure are measured in a unit called mm Hg. Normal  Systolic pressure: below 120.  Diastolic pressure: below 80. Elevated  Systolic pressure: 120-129.  Diastolic pressure: below 80. Hypertension stage 1  Systolic pressure: 130-139.  Diastolic pressure: 80-89. Hypertension stage 2  Systolic pressure: 140 or above.  Diastolic pressure: 90 or above. What health risks are associated with hypertension? Managing your hypertension is an important responsibility. Uncontrolled hypertension can lead to:  A heart attack.  A stroke.  A weakened blood vessel (aneurysm).  Heart failure.  Kidney damage.  Eye damage.  Metabolic syndrome.  Memory and concentration problems. What changes can I make to manage my  hypertension? Hypertension can be managed by making lifestyle changes and possibly by taking medicines. Your health care provider will help you make a plan to bring your blood pressure within a normal range. Eating and drinking   Eat a diet that is high in fiber and potassium, and low in salt (sodium), added sugar, and fat. An example eating plan is called the DASH (Dietary Approaches to Stop Hypertension) diet. To eat this way: ? Eat plenty of fresh fruits and vegetables. Try to fill half of your plate at each meal with fruits and vegetables. ? Eat whole grains, such as whole wheat pasta, brown rice, or whole grain bread. Fill about one quarter of your plate with whole grains. ? Eat low-fat diary products. ? Avoid fatty cuts of meat, processed or cured meats, and poultry with skin. Fill about one quarter of your plate with lean proteins such as fish, chicken without skin, beans, eggs, and tofu. ? Avoid premade and processed foods. These tend to be higher in sodium, added sugar, and fat.  Reduce your daily sodium intake. Most people with hypertension should eat less than 1,500 mg of sodium a day.  Limit alcohol intake to no more than 1 drink a day for nonpregnant women and 2 drinks a day for men. One drink equals 12 oz of beer, 5 oz of wine, or 1 oz of hard liquor. Lifestyle  Work with your health care provider to maintain a healthy body weight, or to lose weight. Ask what an ideal weight is for you.  Get at least 30 minutes of exercise that causes your heart to beat faster (aerobic exercise) most days of the week. Activities may include walking, swimming, or biking.  Include exercise   to strengthen your muscles (resistance exercise), such as weight lifting, as part of your weekly exercise routine. Try to do these types of exercises for 30 minutes at least 3 days a week.  Do not use any products that contain nicotine or tobacco, such as cigarettes and e-cigarettes. If you need help quitting,  ask your health care provider.  Control any long-term (chronic) conditions you have, such as high cholesterol or diabetes. Monitoring  Monitor your blood pressure at home as told by your health care provider. Your personal target blood pressure may vary depending on your medical conditions, your age, and other factors.  Have your blood pressure checked regularly, as often as told by your health care provider. Working with your health care provider  Review all the medicines you take with your health care provider because there may be side effects or interactions.  Talk with your health care provider about your diet, exercise habits, and other lifestyle factors that may be contributing to hypertension.  Visit your health care provider regularly. Your health care provider can help you create and adjust your plan for managing hypertension. Will I need medicine to control my blood pressure? Your health care provider may prescribe medicine if lifestyle changes are not enough to get your blood pressure under control, and if:  Your systolic blood pressure is 130 or higher.  Your diastolic blood pressure is 80 or higher. Take medicines only as told by your health care provider. Follow the directions carefully. Blood pressure medicines must be taken as prescribed. The medicine does not work as well when you skip doses. Skipping doses also puts you at risk for problems. Contact a health care provider if:  You think you are having a reaction to medicines you have taken.  You have repeated (recurrent) headaches.  You feel dizzy.  You have swelling in your ankles.  You have trouble with your vision. Get help right away if:  You develop a severe headache or confusion.  You have unusual weakness or numbness, or you feel faint.  You have severe pain in your chest or abdomen.  You vomit repeatedly.  You have trouble breathing. Summary  Hypertension is when the force of blood pumping  through your arteries is too strong. If this condition is not controlled, it may put you at risk for serious complications.  Your personal target blood pressure may vary depending on your medical conditions, your age, and other factors. For most people, a normal blood pressure is less than 120/80.  Hypertension is managed by lifestyle changes, medicines, or both. Lifestyle changes include weight loss, eating a healthy, low-sodium diet, exercising more, and limiting alcohol. This information is not intended to replace advice given to you by your health care provider. Make sure you discuss any questions you have with your health care provider. Document Revised: 04/13/2018 Document Reviewed: 11/18/2015 Elsevier Patient Education  [place], Adult After being diagnosed with an anxiety disorder, you may be relieved to know why you have felt or behaved a certain way. You may also feel overwhelmed about the treatment ahead and what it will mean for your life. With care and support, you can manage this condition and recover from it. How to manage lifestyle changes Managing stress and anxiety  Stress is your body's reaction to life changes and events, both good and bad. Most stress will last just a few hours, but stress can be ongoing and can lead to more than just stress. Although stress  can play a major role in anxiety, it is not the same as anxiety. Stress is usually caused by something external, such as a deadline, test, or competition. Stress normally passes after the triggering event has ended.  Anxiety is caused by something internal, such as imagining a terrible outcome or worrying that something will go wrong that will devastate you. Anxiety often does not go away even after the triggering event is over, and it can become long-term (chronic) worry. It is important to understand the differences between stress and anxiety and to manage your stress effectively so that it does  not lead to an anxious response. Talk with your health care provider or a counselor to learn more about reducing anxiety and stress. He or she may suggest tension reduction techniques, such as:  Music therapy. This can include creating or listening to music that you enjoy and that inspires you.  Mindfulness-based meditation. This involves being aware of your normal breaths while not trying to control your breathing. It can be done while sitting or walking.  Centering prayer. This involves focusing on a word, phrase, or sacred image that means something to you and brings you peace.  Deep breathing. To do this, expand your stomach and inhale slowly through your nose. Hold your breath for 3-5 seconds. Then exhale slowly, letting your stomach muscles relax.  Self-talk. This involves identifying thought patterns that lead to anxiety reactions and changing those patterns.  Muscle relaxation. This involves tensing muscles and then relaxing them. Choose a tension reduction technique that suits your lifestyle and personality. These techniques take time and practice. Set aside 5-15 minutes a day to do them. Therapists can offer counseling and training in these techniques. The training to help with anxiety may be covered by some insurance plans. Other things you can do to manage stress and anxiety include:  Keeping a stress/anxiety diary. This can help you learn what triggers your reaction and then learn ways to manage your response.  Thinking about how you react to certain situations. You may not be able to control everything, but you can control your response.  Making time for activities that help you relax and not feeling guilty about spending your time in this way.  Visual imagery and yoga can help you stay calm and relax.  Medicines Medicines can help ease symptoms. Medicines for anxiety include:  Anti-anxiety drugs.  Antidepressants. Medicines are often used as a primary treatment for  anxiety disorder. Medicines will be prescribed by a health care provider. When used together, medicines, psychotherapy, and tension reduction techniques may be the most effective treatment. Relationships Relationships can play a big part in helping you recover. Try to spend more time connecting with trusted friends and family members. Consider going to couples counseling, taking family education classes, or going to family therapy. Therapy can help you and others better understand your condition. How to recognize changes in your anxiety Everyone responds differently to treatment for anxiety. Recovery from anxiety happens when symptoms decrease and stop interfering with your daily activities at home or work. This may mean that you will start to:  Have better concentration and focus. Worry will interfere less in your daily thinking.  Sleep better.  Be less irritable.  Have more energy.  Have improved memory. It is important to recognize when your condition is getting worse. Contact your health care provider if your symptoms interfere with home or work and you feel like your condition is not improving. Follow these instructions at home:  Activity  Exercise. Most adults should do the following: ? Exercise for at least 150 minutes each week. The exercise should increase your heart rate and make you sweat (moderate-intensity exercise). ? Strengthening exercises at least twice a week.  Get the right amount and quality of sleep. Most adults need 7-9 hours of sleep each night. Lifestyle   Eat a healthy diet that includes plenty of vegetables, fruits, whole grains, low-fat dairy products, and lean protein. Do not eat a lot of foods that are high in solid fats, added sugars, or salt.  Make choices that simplify your life.  Do not use any products that contain nicotine or tobacco, such as cigarettes, e-cigarettes, and chewing tobacco. If you need help quitting, ask your health care  provider.  Avoid caffeine, alcohol, and certain over-the-counter cold medicines. These may make you feel worse. Ask your pharmacist which medicines to avoid. General instructions  Take over-the-counter and prescription medicines only as told by your health care provider.  Keep all follow-up visits as told by your health care provider. This is important. Where to find support You can get help and support from these sources:  Self-help groups.  Online and [employer].  A trusted spiritual leader.  Couples counseling.  Family education classes.  Family therapy. Where to find more information You may find that joining a support group helps you deal with your anxiety. The following sources can help you locate counselors or support groups near you:  Mental Health America: www.mentalhealthamerica.net  Anxiety and Depression Association of [place] (ADAA): ProgramCam.de  [employer] Mental [employer]ness (NAMI): www.nami.org Contact a health care provider if you:  Have a hard time staying focused or finishing daily tasks.  Spend many hours a day feeling worried about everyday life.  Become exhausted by worry.  Start to have headaches, feel tense, or have nausea.  Urinate more than normal.  Have diarrhea. Get help right away if you have:  A racing heart and shortness of breath.  Thoughts of hurting yourself or others. If you ever feel like you may hurt yourself or others, or have thoughts about taking your own life, get help right away. You can go to your nearest emergency department or call:  Your local emergency services (911 in the [place]  A suicide crisis helpline, such as the National Suicide Prevention Lifeline at (406)121-8509. This is open 24 hours a day. Summary  Taking steps to learn and use tension reduction techniques can help calm you and help prevent triggering an anxiety reaction.  When used together, medicines, psychotherapy, and tension  reduction techniques may be the most effective treatment.  Family, friends, and partners can play a big part in helping you recover from an anxiety disorder. This information is not intended to replace advice given to you by your health care provider. Make sure you discuss any questions you have with your health care provider. Document Revised: 05/22/2018 Document Reviewed: 05/22/2018 Elsevier Patient Education  2020 [employer]  Living With Depression Everyone experiences occasional disappointment, sadness, and loss in their lives. When you are feeling down, blue, or sad for at least 2 weeks in a row, it may mean that you have depression. Depression can affect your thoughts and feelings, relationships, daily activities, and physical health. It is caused by changes in the way your brain functions. If you receive a diagnosis of depression, your health care provider will tell you which type of depression you have and what treatment options are available to you. If  you are living with depression, there are ways to help you recover from it and also ways to prevent it from coming back. How to cope with lifestyle changes Coping with stress     Stress is your body's reaction to life changes and events, both good and bad. Stressful situations may include:  Getting married.  The death of a spouse.  Losing a job.  Retiring.  Having a baby. Stress can last just a few hours or it can be ongoing. Stress can play a major role in depression, so it is important to learn both how to cope with stress and how to think about it differently. Talk with your health care provider or a counselor if you would like to learn more about stress reduction. He or she may suggest some stress reduction techniques, such as:  Music therapy. This can include creating music or listening to music. Choose music that you enjoy and that inspires you.  Mindfulness-based meditation. This kind of meditation can be done while  sitting or walking. It involves being aware of your normal breaths, rather than trying to control your breathing.  Centering prayer. This is a kind of meditation that involves focusing on a spiritual word or phrase. Choose a word, phrase, or sacred image that is meaningful to you and that brings you peace.  Deep breathing. To do this, expand your stomach and inhale slowly through your nose. Hold your breath for 3-5 seconds, then exhale slowly, allowing your stomach muscles to relax.  Muscle relaxation. This involves intentionally tensing muscles then relaxing them. Choose a stress reduction technique that fits your lifestyle and personality. Stress reduction techniques take time and practice to develop. Set aside 5-15 minutes a day to do them. Therapists can offer training in these techniques. The training may be covered by some insurance plans. Other things you can do to manage stress include:  Keeping a stress diary. This can help you learn what triggers your stress and ways to control your response.  Understanding what your limits are and saying no to requests or events that lead to a schedule that is too full.  Thinking about how you respond to certain situations. You may not be able to control everything, but you can control how you react.  Adding humor to your life by watching funny films or TV shows.  Making time for activities that help you relax and not feeling guilty about spending your time this way.  Medicines Your health care provider may suggest certain medicines if he or she feels that they will help improve your condition. Avoid using alcohol and other substances that may prevent your medicines from working properly (may interact). It is also important to:  Talk with your pharmacist or health care provider about all the medicines that you take, their possible side effects, and what medicines are safe to take together.  Make it your goal to take part in all treatment decisions  (shared decision-making). This includes giving input on the side effects of medicines. It is best if shared decision-making with your health care provider is part of your total treatment plan. If your health care provider prescribes a medicine, you may not notice the full benefits of it for 4-8 weeks. Most people who are treated for depression need to be on medicine for at least 6-12 months after they feel better. If you are taking medicines as part of your treatment, do not stop taking medicines without first talking to your health care provider. You  may need to have the medicine slowly decreased (tapered) over time to decrease the risk of harmful side effects. Relationships Your health care provider may suggest family therapy along with individual therapy and drug therapy. While there may not be family problems that are causing you to feel depressed, it is still important to make sure your family learns as much as they can about your mental health. Having your family's support can help make your treatment successful. How to recognize changes in your condition Everyone has a different response to treatment for depression. Recovery from major depression happens when you have not had signs of major depression for two months. This may mean that you will start to:  Have more interest in doing activities.  Feel less hopeless than you did 2 months ago.  Have more energy.  Overeat less often, or have better or improving appetite.  Have better concentration. Your health care provider will work with you to decide the next steps in your recovery. It is also important to recognize when your condition is getting worse. Watch for these signs:  Having fatigue or low energy.  Eating too much or too little.  Sleeping too much or too little.  Feeling restless, agitated, or hopeless.  Having trouble concentrating or making decisions.  Having unexplained physical complaints.  Feeling irritable, angry, or  aggressive. Get help as soon as you or your family members notice these symptoms coming back. How to get support and help from others How to talk with friends and family members about your condition  Talking to friends and family members about your condition can provide you with one way to get support and guidance. Reach out to trusted friends or family members, explain your symptoms to them, and let them know that you are working with a health care provider to treat your depression. Financial resources Not all insurance plans cover mental health care, so it is important to check with your insurance carrier. If paying for co-pays or counseling services is a problem, search for a local or county mental health care center. They may be able to offer public mental health care services at low or no cost when you are not able to see a private health care provider. If you are taking medicine for depression, you may be able to get the generic form, which may be less expensive. Some makers of prescription medicines also offer help to patients who cannot afford the medicines they need. Follow these instructions at home:   Get the right amount and quality of sleep.  Cut down on using caffeine, tobacco, alcohol, and other potentially harmful substances.  Try to exercise, such as walking or lifting small weights.  Take over-the-counter and prescription medicines only as told by your health care provider.  Eat a healthy diet that includes plenty of vegetables, fruits, whole grains, low-fat dairy products, and lean protein. Do not eat a lot of foods that are high in solid fats, added sugars, or salt.  Keep all follow-up visits as told by your health care provider. This is important. Contact a health care provider if:  You stop taking your antidepressant medicines, and you have any of these symptoms: ? Nausea. ? Headache. ? Feeling lightheaded. ? Chills and body aches. ? Not being able to sleep  (insomnia).  You or your friends and family think your depression is getting worse. Get help right away if:  You have thoughts of hurting yourself or others. If you ever feel like you may  hurt yourself or others, or have thoughts about taking your own life, get help right away. You can go to your nearest emergency department or call:  Your local emergency services (911 in the [place]  A suicide crisis helpline, such as the [facility] at 8066027304. This is open 24-hours a day. Summary  If you are living with depression, there are ways to help you recover from it and also ways to prevent it from coming back.  Work with your health care team to create a management plan that includes counseling, stress management techniques, and healthy lifestyle habits. This information is not intended to replace advice given to you by your health care provider. Make sure you discuss any questions you have with your health care provider. Document Revised: 04/13/2018 Document Reviewed: 11/23/2015 Elsevier Patient Education  [place].

## 2020-01-01 NOTE — Progress Notes (Signed)
Subjective:    Patient ID: Gail Lloyd, female    DOB: 1965-03-26, 54 y.o.   MRN: [id number]  No chief complaint on file.   HPI Patient is a 54 year old female with past medical history significant for HTN, pre-DM who was seen for follow-up on HTN.  Seen in ED on 12/21/2019 and in clinic on 12/24/2019 for elevated blood pressure.  BP on day of ED visit 230/110.  Patient had palpitations, dizziness, and fatigue at the time.  During OFV patient started on losartan 50 mg in addition to Coreg 6.25 mg twice daily and Norvasc 5 mg daily.  Patient compliant with meds, notes BP improving: 230/100, 133/81, 160/83, 142/72, 121/72, 138/80, 132/73 with a pulse 80s-110s.  Pt drinking 2 L of water per day, decreasing sodium, walking, and dancing for exercise.  Patient notes feeling palpitations at times, decreased sleep, feeling nervous/anxious about having to drive.  States symptoms started in November after an ED visit 2/2 an episode of hypoglycemia.  Also notes increased anxiety as she thinks about her mother who had blood pressure issues died in her 50s/60s.  Past Medical History:  Diagnosis Date  . Heart murmur     Allergies  Allergen Reactions  . Penicillins Hives and Itching  . Sudafed [Pseudoephedrine Hcl] Palpitations    ROS General: Denies fever, chills, night sweats, changes in weight, changes in appetite HEENT: Denies headaches, ear pain, changes in vision, rhinorrhea, sore throat CV: Denies CP, palpitations, SOB, orthopnea Pulm: Denies SOB, cough, wheezing GI: Denies abdominal pain, nausea, vomiting, diarrhea, constipation GU: Denies dysuria, hematuria, frequency, vaginal discharge Msk: Denies muscle cramps, joint pains Neuro: Denies weakness, numbness, tingling Skin: Denies rashes, bruising Psych: Denies hallucinations  + anxiety, depression  Objective:    Blood pressure 120/78, pulse 83, temperature 99.1 F (37.3 C), weight 197 lb 3.2 oz (89.4 kg), SpO2 99 %.  Gen. Pleasant,  well-nourished, in no distress, normal affect  HEENT: [place]/AT, face symmetric, conjunctiva clear, no scleral icterus, PERRLA, EOMI, nares patent without drainage Lungs: no accessory muscle use, CTAB, no wheezes or rales Cardiovascular: RRR, no m/r/g, no peripheral edema Musculoskeletal: No deformities, no cyanosis or clubbing, normal tone Neuro:  A&Ox3, CN II-XII intact, normal gait Skin:  Warm, no lesions/ rash   Wt Readings from Last 3 Encounters:  01/01/20 197 lb 3.2 oz (89.4 kg)  12/24/19 197 lb (89.4 kg)  12/20/19 202 lb 3.2 oz (91.7 kg)    Lab Results  Component Value Date   WBC 8.9 12/21/2019   HGB 11.6 (L) 12/21/2019   HCT 34.9 (L) 12/21/2019   PLT 363 12/21/2019   GLUCOSE 172 (H) 12/21/2019   CHOL 190 12/02/2019   TRIG 134 12/02/2019   HDL 50 12/02/2019   LDLCALC 115 (H) 12/02/2019   ALT 12 12/02/2019   AST 9 (L) 12/02/2019   NA 137 12/21/2019   K 3.3 (L) 12/21/2019   CL 102 12/21/2019   CREATININE 0.87 12/21/2019   BUN 11 12/21/2019   CO2 21 (L) 12/21/2019   TSH 1.22 12/02/2019   HGBA1C 6.3 (H) 12/02/2019    Assessment/Plan:  Essential hypertension  -Improving/controlled -Continue lifestyle modifications -Discussed how anxiety can contribute to elevation and cause palpitations -Continue current medications including losartan 50 mg, Norvasc 5 mg, Coreg 6.25 mg twice daily -Patient encouraged to continue checking BP at home and keep a log to bring with her to clinic - Plan: Basic metabolic panel, Basic metabolic panel  Anxiety -GAD-7 score 15 -TSH normal 11/2019 -Likely  2/2 recent concerns about health -Patient reassured of EKG and labs were largely normal -Discussed counseling -Patient encouraged to look into E[facility] and area [facility] providers -We will continue to monitor -Given precautions  Depression, major, single episode, moderate (HCC) -PHQ-9 score 15 -Discussed various options including counseling -Patient to look into EAP and area provider -Continue  to monitor -Given precautions  Hypokalemia -K+ 3.3 12/21/2019 -Patient encouraged to eat potassium rich foods -Repeat BMP  F/u in 1 month, sooner if needed  Abbe Amsterdam, MD

## 2020-01-27 ENCOUNTER — Other Ambulatory Visit: Payer: Self-pay | Admitting: Family Medicine

## 2020-02-05 ENCOUNTER — Ambulatory Visit: Payer: 59 | Admitting: Family Medicine

## 2020-02-10 ENCOUNTER — Encounter: Payer: Self-pay | Admitting: Family Medicine

## 2020-02-10 ENCOUNTER — Other Ambulatory Visit: Payer: Self-pay

## 2020-02-10 ENCOUNTER — Ambulatory Visit: Payer: 59 | Admitting: Family Medicine

## 2020-02-10 VITALS — BP 118/78 | HR 84 | Temp 98.8°F | Wt 196.0 lb

## 2020-02-10 DIAGNOSIS — J0100 Acute maxillary sinusitis, unspecified: Secondary | ICD-10-CM

## 2020-02-10 DIAGNOSIS — I498 Other specified cardiac arrhythmias: Secondary | ICD-10-CM

## 2020-02-10 DIAGNOSIS — I10 Essential (primary) hypertension: Secondary | ICD-10-CM | POA: Diagnosis not present

## 2020-02-10 MED ORDER — AZITHROMYCIN 250 MG PO TABS: ORAL_TABLET | ORAL | 0 refills | Status: DC

## 2020-02-10 NOTE — Progress Notes (Signed)
Subjective:    Patient ID: Gail Lloyd, female    DOB: 09-09-1965, 55 y.o.   MRN: [id number]  No chief complaint on file.   HPI Patient is a 55 yo female with pmh sig for HTN, snoring, pre-DM who was seen today for follow-up on BP.  Taking losartan 50 mg, Norvasc 5 mg, Coreg 6.25 mg daily.  Pt notes improvement in BP and feeling less stressed.  Readings typically 120s/70s with heart rates 60s-80s.  Pt had an elevated BP reading 160/80s with pulse in the 90s.  BP improved after drinking more water and relaxing.  Pt working on meditation and overall stress relief.  Pt continues to have occasional flutter and heart murmur on left side or doing the dishes or folding clothes.  Does not last long.  Pt becomes more anxious the fluttering started.  Pt notes history of heart problems in her mother, aunts, and grandparents.  Pt no longer feeling as depressed and anxious.  Was worried about her bp being so high.  Did not get a chance to look into counseling.  Pt with sinus drainage and pressure.  Feels like symptoms started after last OFV in Dec 2021.  Using flonase.   Past Medical History:  Diagnosis Date  . Heart murmur     Allergies  Allergen Reactions  . Penicillins Hives and Itching  . Sudafed [Pseudoephedrine Hcl] Palpitations    ROS General: Denies fever, chills, night sweats, changes in weight, changes in appetite HEENT: Denies headaches, ear pain, changes in vision, rhinorrhea, sore throat  +sinus drainage CV: Denies CP, palpitations, SOB, orthopnea + heart fluttering Pulm: Denies SOB, cough, wheezing GI: Denies abdominal pain, nausea, vomiting, diarrhea, constipation GU: Denies dysuria, hematuria, frequency, vaginal discharge Msk: Denies muscle cramps, joint pains Neuro: Denies weakness, numbness, tingling Skin: Denies rashes, bruising Psych: Denies hallucinations + improving anxiety, depression     Objective:    Blood pressure 118/78, pulse 84, temperature 98.8 F (37.1 C),  temperature source Oral, weight 196 lb (88.9 kg), SpO2 97 %.  Gen. Pleasant, well-nourished, in no distress, normal affect   HEENT: [place]/AT, face symmetric, conjunctiva clear, no scleral icterus, PERRLA, EOMI, nares patent without drainage, TTP of maxillary sinuses L>R.  No TTP of ethmoid or frontal sinuses.  Pharynx without erythema or exudate.  External ears and canals normal bilaterally.  TMs full bilaterally without erythema or dullness in color.  Mild cervical lymphadenopathy R>L. Lungs: no accessory muscle use, CTAB, no wheezes or rales Cardiovascular: RRR, no m/r/g, no peripheral edema Musculoskeletal: No deformities, no cyanosis or clubbing, normal tone Neuro:  A&Ox3, CN II-XII intact, normal gait Skin:  Warm, no lesions/ rash   Wt Readings from Last 3 Encounters:  02/10/20 196 lb (88.9 kg)  01/01/20 197 lb 3.2 oz (89.4 kg)  12/24/19 197 lb (89.4 kg)    Lab Results  Component Value Date   WBC 8.9 12/21/2019   HGB 11.6 (L) 12/21/2019   HCT 34.9 (L) 12/21/2019   PLT 363 12/21/2019   GLUCOSE 96 01/01/2020   CHOL 190 12/02/2019   TRIG 134 12/02/2019   HDL 50 12/02/2019   LDLCALC 115 (H) 12/02/2019   ALT 12 12/02/2019   AST 9 (L) 12/02/2019   NA 137 01/01/2020   K 4.2 01/01/2020   CL 100 01/01/2020   CREATININE 0.77 01/01/2020   BUN 12 01/01/2020   CO2 29 01/01/2020   TSH 1.22 12/02/2019   HGBA1C 6.3 (H) 12/02/2019    Assessment/Plan:  Essential hypertension  -  controlled -Continue current medications including Norvasc 5 mg, losartan 50 mg, Coreg 6.25 mg twice daily -Continue lifestyle modifications -Continue checking BP a few times per week and keeping a log to bring with you to clinic -We will check on referral placed for sleep study in late 2021 as snoring/OSA may be contributing to patient's HTN along with family history.. -Given handout - Plan: Ambulatory referral to Cardiology  Periodic heart flutter -PHQ-9 score 3 and GAD-7 score 3 this visit.  Was 15 and 15  on 01/01/2020. -TSH and free T4 normal on 12/02/2019 -discussed holter monitor - Plan: Ambulatory referral to Cardiology  Subacute maxillary sinusitis  -Continue Flonase and other supportive care including Tylenol if needed for pain/discomfort.   -Consider saline nasal rinse - Plan: azithromycin (ZITHROMAX) 250 MG tablet  F/u as needed  Grier Mitts, MD

## 2020-02-10 NOTE — Patient Instructions (Addendum)
Referral was placed for you to see the cardiologist.  He should expect a call about this appointment.  I will also check on the sleep study order that was placed in October 2021, as snoring and trouble with sleep can cause elevation in blood pressure.  Continue current medications. Managing Your Hypertension Hypertension, also called high blood pressure, is when the force of the blood pressing against the walls of the arteries is too strong. Arteries are blood vessels that carry blood from your heart throughout your body. Hypertension forces the heart to work harder to pump blood and may cause the arteries to become narrow or stiff. Understanding blood pressure readings Your personal target blood pressure may vary depending on your medical conditions, your age, and other factors. A blood pressure reading includes a higher number over a lower number. Ideally, your blood pressure should be below 120/80. You should know that:  The first, or top, number is called the systolic pressure. It is a measure of the pressure in your arteries as your heart beats.  The second, or bottom number, is called the diastolic pressure. It is a measure of the pressure in your arteries as the heart relaxes. Blood pressure is classified into four stages. Based on your blood pressure reading, your health care provider may use the following stages to determine what type of treatment you need, if any. Systolic pressure and diastolic pressure are measured in a unit called mmHg. Normal  Systolic pressure: below 782.  Diastolic pressure: below 80. Elevated  Systolic pressure: 423-536.  Diastolic pressure: below 80. Hypertension stage 1  Systolic pressure: 144-315.  Diastolic pressure: 40-08. Hypertension stage 2  Systolic pressure: 676 or above.  Diastolic pressure: 90 or above. How can this condition affect me? Managing your hypertension is an important responsibility. Over time, hypertension can damage the  arteries and decrease blood flow to important parts of the body, including the brain, heart, and kidneys. Having untreated or uncontrolled hypertension can lead to:  A heart attack.  A stroke.  A weakened blood vessel (aneurysm).  Heart failure.  Kidney damage.  Eye damage.  Metabolic syndrome.  Memory and concentration problems.  Vascular dementia. What actions can I take to manage this condition? Hypertension can be managed by making lifestyle changes and possibly by taking medicines. Your health care provider will help you make a plan to bring your blood pressure within a normal range. Nutrition  Eat a diet that is high in fiber and potassium, and low in salt (sodium), added sugar, and fat. An example eating plan is called the Dietary Approaches to Stop Hypertension (DASH) diet. To eat this way: ? Eat plenty of fresh fruits and vegetables. Try to fill one-half of your plate at each meal with fruits and vegetables. ? Eat whole grains, such as whole-wheat pasta, brown rice, or whole-grain bread. Fill about one-fourth of your plate with whole grains. ? Eat low-fat dairy products. ? Avoid fatty cuts of meat, processed or cured meats, and poultry with skin. Fill about one-fourth of your plate with lean proteins such as fish, chicken without skin, beans, eggs, and tofu. ? Avoid pre-made and processed foods. These tend to be higher in sodium, added sugar, and fat.  Reduce your daily sodium intake. Most people with hypertension should eat less than 1,500 mg of sodium a day.   Lifestyle  Work with your health care provider to maintain a healthy body weight or to lose weight. Ask what an ideal weight is for you.  Get at least 30 minutes of exercise that causes your heart to beat faster (aerobic exercise) most days of the week. Activities may include walking, swimming, or biking.  Include exercise to strengthen your muscles (resistance exercise), such as weight lifting, as part of your  weekly exercise routine. Try to do these types of exercises for 30 minutes at least 3 days a week.  Do not use any products that contain nicotine or tobacco, such as cigarettes, e-cigarettes, and chewing tobacco. If you need help quitting, ask your health care provider.  Control any long-term (chronic) conditions you have, such as high cholesterol or diabetes.  Identify your sources of stress and find ways to manage stress. This may include meditation, deep breathing, or making time for fun activities.   Alcohol use  Do not drink alcohol if: ? Your health care provider tells you not to drink. ? You are pregnant, may be pregnant, or are planning to become pregnant.  If you drink alcohol: ? Limit how much you use to:  0-1 drink a day for women.  0-2 drinks a day for men. ? Be aware of how much alcohol is in your drink. In the [place] one drink equals one 12 oz bottle of beer (355 mL), one 5 oz glass of wine (148 mL), or one 1 oz glass of hard liquor (44 mL). Medicines Your health care provider may prescribe medicine if lifestyle changes are not enough to get your blood pressure under control and if:  Your systolic blood pressure is 130 or higher.  Your diastolic blood pressure is 80 or higher. Take medicines only as told by your health care provider. Follow the directions carefully. Blood pressure medicines must be taken as told by your health care provider. The medicine does not work as well when you skip doses. Skipping doses also puts you at risk for problems. Monitoring Before you monitor your blood pressure:  Do not smoke, drink caffeinated beverages, or exercise within 30 minutes before taking a measurement.  Use the bathroom and empty your bladder (urinate).  Sit quietly for at least 5 minutes before taking measurements. Monitor your blood pressure at home as told by your health care provider. To do this:  Sit with your back straight and supported.  Place your feet flat on  the floor. Do not cross your legs.  Support your arm on a flat surface, such as a table. Make sure your upper arm is at heart level.  Each time you measure, take two or three readings one minute apart and record the results. You may also need to have your blood pressure checked regularly by your health care provider.   General information  Talk with your health care provider about your diet, exercise habits, and other lifestyle factors that may be contributing to hypertension.  Review all the medicines you take with your health care provider because there may be side effects or interactions.  Keep all visits as told by your health care provider. Your health care provider can help you create and adjust your plan for managing your high blood pressure. Where to find more information  National Heart, Lung, and Blood Institute: https://wilson-eaton.com/  American Heart Association: www.heart.org Contact a health care provider if:  You think you are having a reaction to medicines you have taken.  You have repeated (recurrent) headaches.  You feel dizzy.  You have swelling in your ankles.  You have trouble with your vision. Get help right away if:  You develop  a severe headache or confusion.  You have unusual weakness or numbness, or you feel faint.  You have severe pain in your chest or abdomen.  You vomit repeatedly.  You have trouble breathing. These symptoms may represent a serious problem that is an emergency. Do not wait to see if the symptoms will go away. Get medical help right away. Call your local emergency services (911 in the [place] Do not drive yourself to the hospital. Summary  Hypertension is when the force of blood pumping through your arteries is too strong. If this condition is not controlled, it may put you at risk for serious complications.  Your personal target blood pressure may vary depending on your medical conditions, your age, and other factors. For most  people, a normal blood pressure is less than 120/80.  Hypertension is managed by lifestyle changes, medicines, or both.  Lifestyle changes to help manage hypertension include losing weight, eating a healthy, low-sodium diet, exercising more, stopping smoking, and limiting alcohol. This information is not intended to replace advice given to you by your health care provider. Make sure you discuss any questions you have with your health care provider. Document Revised: 01/25/2019 Document Reviewed: 11/20/2018 Elsevier Patient Education  2021 [place], Adult Feeling a certain amount of stress is normal. Stress helps our body and mind get ready to deal with the demands of life. Stress hormones can motivate you to do well at work and meet your responsibilities. However severe or long-lasting (chronic) stress can affect your mental and physical health. Chronic stress puts you at higher risk for anxiety, depression, and other health problems like digestive problems, muscle aches, heart disease, high blood pressure, and stroke. What are the causes? Common causes of stress include:  Demands from work, such as deadlines, feeling overworked, or having long hours.  Pressures at home, such as money issues, disagreements with a spouse, or parenting issues.  Pressures from major life changes, such as divorce, moving, loss of a loved one, or chronic illness. You may be at higher risk for stress-related problems if you do not get enough sleep, are in poor health, do not have emotional support, or have a mental health disorder like anxiety or depression. How to recognize stress Stress can make you:  Have trouble sleeping.  Feel sad, anxious, irritable, or overwhelmed.  Lose your appetite.  Overeat or want to eat unhealthy foods.  Want to use drugs or alcohol. Stress can also cause physical symptoms, such as:  Sore, tense muscles, especially in the shoulders and  neck.  Headaches.  Trouble breathing.  A faster heart rate.  Stomach pain, nausea, or vomiting.  Diarrhea or constipation.  Trouble concentrating. Follow these instructions at home: Lifestyle  Identify the source of your stress and your reaction to it. See a therapist who can help you change your reactions.  When there are stressful events: ? Talk about it with family, friends, or co-workers. ? Try to think realistically about stressful events and not ignore them or overreact. ? Try to find the positives in a stressful situation and not focus on the negatives. ? Cut back on responsibilities at work and home, if possible. Ask for help from friends or family members if you need it.  Find ways to cope with stress, such as: ? Meditation. ? Deep breathing. ? Yoga or tai chi. ? Progressive muscle relaxation. ? Doing art, playing music, or reading. ? Making time for fun activities. ? Spending time with family  and friends.  Get support from family, friends, or spiritual resources. Eating and drinking  Eat a healthy diet. This includes: ? Eating foods that are high in fiber, such as beans, whole grains, and fresh fruits and vegetables. ? Limiting foods that are high in fat and processed sugars, such as fried and sweet foods.  Do not skip meals or overeat.  Drink enough fluid to keep your urine pale yellow. Alcohol use  Do not drink alcohol if: ? Your health care provider tells you not to drink. ? You are pregnant, may be pregnant, or are planning to become pregnant.  Drinking alcohol is a way some people try to ease their stress. This can be dangerous, so if you drink alcohol: ? Limit how much you use to:  0-1 drink a day for women.  0-2 drinks a day for men. ? Be aware of how much alcohol is in your drink. In the [place] one drink equals one 12 oz bottle of beer (355 mL), one 5 oz glass of wine (148 mL), or one 1 oz glass of hard liquor (44 mL). Activity  Include 30  minutes of exercise in your daily schedule. Exercise is a good stress reducer.  Include time in your day for an activity that you find relaxing. Try taking a walk, going on a bike ride, reading a book, or listening to music.  Schedule your time in a way that lowers stress, and keep a consistent schedule. Prioritize what is most important to get done.   General instructions  Get enough sleep. Try to go to sleep and get up at about the same time every day.  Take over-the-counter and prescription medicines only as told by your health care provider.  Do not use any products that contain nicotine or tobacco, such as cigarettes, e-cigarettes, and chewing tobacco. If you need help quitting, ask your health care provider.  Do not use drugs or smoke to cope with stress.  Keep all follow-up visits as told by your health care provider. This is important. Where to find support  Talk with your health care provider about stress management or finding a support group.  Find a therapist to work with you on your stress management techniques. Contact a health care provider if:  Your stress symptoms get worse.  You are unable to manage your stress at home.  You are struggling to stop using drugs or alcohol. Get help right away if:  You may be a danger to yourself or others.  You have any thoughts of death or suicide. If you ever feel like you may hurt yourself or others, or have thoughts about taking your own life, get help right away. You can go to your nearest emergency department or call:  Your local emergency services (911 in the [place]  A suicide crisis helpline, such as the [place] at 908-162-3942. This is open 24 hours a day. Summary  Feeling a certain amount of stress is normal, but severe or long-lasting (chronic) stress can affect your mental and physical health.  Chronic stress can put you at higher risk for anxiety, depression, and other health  problems like digestive problems, muscle aches, heart disease, high blood pressure, and stroke.  You may be at higher risk for stress-related problems if you do not get enough sleep, are in poor health, lack emotional support, or have a mental health disorder like anxiety or depression.  Identify the source of your stress and your reaction to it.  Try talking about stressful events with family, friends, or co-workers, finding a coping method, or getting support from spiritual resources.  If you need more help, talk with your health care provider about finding a support group or a mental health therapist. This information is not intended to replace advice given to you by your health care provider. Make sure you discuss any questions you have with your health care provider. Document Revised: 07/18/2018 Document Reviewed: 07/18/2018 Elsevier Patient Education  2021 [place].  Sinusitis, Adult Sinusitis is soreness and swelling (inflammation) of your sinuses. Sinuses are hollow spaces in the bones around your face. They are located:  Around your eyes.  In the middle of your forehead.  Behind your nose.  In your cheekbones. Your sinuses and nasal passages are lined with a fluid called mucus. Mucus drains out of your sinuses. Swelling can trap mucus in your sinuses. This lets germs (bacteria, virus, or fungus) grow, which leads to infection. Most of the time, this condition is caused by a virus. What are the causes? This condition is caused by:  Allergies.  Asthma.  Germs.  Things that block your nose or sinuses.  Growths in the nose (nasal polyps).  Chemicals or irritants in the air.  Fungus (rare). What increases the risk? You are more likely to develop this condition if:  You have a weak body defense system (immune system).  You do a lot of swimming or diving.  You use nasal sprays too much.  You smoke. What are the signs or symptoms? The main symptoms of this  condition are pain and a feeling of pressure around the sinuses. Other symptoms include:  Stuffy nose (congestion).  Runny nose (drainage).  Swelling and warmth in the sinuses.  Headache.  Toothache.  A cough that may get worse at night.  Mucus that collects in the throat or the back of the nose (postnasal drip).  Being unable to smell and taste.  Being very tired (fatigue).  A fever.  Sore throat.  Bad breath. How is this diagnosed? This condition is diagnosed based on:  Your symptoms.  Your medical history.  A physical exam.  Tests to find out if your condition is short-term (acute) or long-term (chronic). Your doctor may: ? Check your nose for growths (polyps). ? Check your sinuses using a tool that has a light (endoscope). ? Check for allergies or germs. ? Do imaging tests, such as an MRI or CT scan. How is this treated? Treatment for this condition depends on the cause and whether it is short-term or long-term.  If caused by a virus, your symptoms should go away on their own within 10 days. You may be given medicines to relieve symptoms. They include: ? Medicines that shrink swollen tissue in the nose. ? Medicines that treat allergies (antihistamines). ? A spray that treats swelling of the nostrils. ? Rinses that help get rid of thick mucus in your nose (nasal saline washes).  If caused by bacteria, your doctor may wait to see if you will get better without treatment. You may be given antibiotic medicine if you have: ? A very bad infection. ? A weak body defense system.  If caused by growths in the nose, you may need to have surgery. Follow these instructions at home: Medicines  Take, use, or apply over-the-counter and prescription medicines only as told by your doctor. These may include nasal sprays.  If you were prescribed an antibiotic medicine, take it as told by your doctor.  Do not stop taking the antibiotic even if you start to feel  better. Hydrate and humidify  Drink enough water to keep your pee (urine) pale yellow.  Use a cool mist humidifier to keep the humidity level in your home above 50%.  Breathe in steam for 10-15 minutes, 3-4 times a day, or as told by your doctor. You can do this in the bathroom while a hot shower is running.  Try not to spend time in cool or dry air.   Rest  Rest as much as you can.  Sleep with your head raised (elevated).  Make sure you get enough sleep each night. General instructions  Put a warm, moist washcloth on your face 3-4 times a day, or as often as told by your doctor. This will help with discomfort.  Wash your hands often with soap and water. If there is no soap and water, use hand sanitizer.  Do not smoke. Avoid being around people who are smoking (secondhand smoke).  Keep all follow-up visits as told by your doctor. This is important.   Contact a doctor if:  You have a fever.  Your symptoms get worse.  Your symptoms do not get better within 10 days. Get help right away if:  You have a very bad headache.  You cannot stop throwing up (vomiting).  You have very bad pain or swelling around your face or eyes.  You have trouble seeing.  You feel confused.  Your neck is stiff.  You have trouble breathing. Summary  Sinusitis is swelling of your sinuses. Sinuses are hollow spaces in the bones around your face.  This condition is caused by tissues in your nose that become inflamed or swollen. This traps germs. These can lead to infection.  If you were prescribed an antibiotic medicine, take it as told by your doctor. Do not stop taking it even if you start to feel better.  Keep all follow-up visits as told by your doctor. This is important. This information is not intended to replace advice given to you by your health care provider. Make sure you discuss any questions you have with your health care provider. Document Revised: 05/22/2017 Document Reviewed:  05/22/2017 Elsevier Patient Education  2021 [employer].

## 2020-02-23 ENCOUNTER — Other Ambulatory Visit: Payer: Self-pay | Admitting: Family Medicine

## 2020-02-23 DIAGNOSIS — I10 Essential (primary) hypertension: Secondary | ICD-10-CM

## 2020-02-24 NOTE — Progress Notes (Signed)
Cardiology Office Note   Date:  02/25/2020   ID:  Gail Lloyd, DOB 24-May-1965, MRN [id number]  PCP:  Billie Ruddy, MD    No chief complaint on file.  HTN/palpitations  Wt Readings from Last 3 Encounters:  02/25/20 196 lb 3.2 oz (89 kg)  02/10/20 196 lb (88.9 kg)  01/01/20 197 lb 3.2 oz (89.4 kg)       History of Present Illness: Gail Lloyd is a 55 y.o. female who is being seen today for the evaluation of fluttering, HTN at the request of Billie Ruddy, MD.  Negative neuro w/u for transient global amnesia in 11/21.  Fluttering started several months ago.  Seemed to be intensifying in the past few months.  The sx come regardless of position or activity level. She does not drink caffeine. No syncope.  Some dizziness.  Sx can last several minutes at a time.   BP has been better controlled.   Grandmother died from presumed before age of 49.  Patient's sister with HTN.    Can have some SHOB with stress. 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    Past Medical History:  Diagnosis Date  . Heart murmur     Past Surgical History:  Procedure Laterality Date  . WISDOM TOOTH EXTRACTION       Current Outpatient Medications  Medication Sig Dispense Refill  . amLODipine (NORVASC) 5 MG tablet TAKE 1 TABLET(5 MG) BY MOUTH DAILY 90 tablet 0  . Ascorbic Acid (VITAMIN C) 1000 MG tablet Take 1,000 mg by mouth daily.    . carvedilol (COREG) 6.25 MG tablet TAKE 1 TABLET (6.25 MG TOTAL) BY MOUTH 2 (TWO) TIMES DAILY WITH A MEAL. 180 tablet 0  . diphenhydrAMINE (BENADRYL) 25 MG tablet Take 25 mg by mouth every 6 (six) hours as needed for allergies.    . fluticasone (FLONASE) 50 MCG/ACT nasal spray SHAKE LIQUID AND USE 1 SPRAY IN EACH NOSTRIL DAILY 16 g 0  . ipratropium (ATROVENT) 0.06 % nasal spray Place 2 sprays into both nostrils 4 (four) times daily. 15 mL 0  . losartan (COZAAR) 50 MG tablet Take 1 tablet (50 mg total) by mouth  daily. 30 tablet 3  . melatonin 5 MG TABS Take 5 mg by mouth daily as needed (sleep).    . Multiple Vitamin (MULTIVITAMIN WITH MINERALS) TABS tablet Take 1 tablet by mouth daily.     No current facility-administered medications for this visit.    Allergies:   Penicillins and Sudafed [pseudoephedrine hcl]    Social History:  The patient  reports that she has never smoked. She has never used smokeless tobacco. She reports that she does not drink alcohol and does not use drugs.   Family History:  The patient's family history includes Cancer in her father; Diabetes in her mother; Hypertension in her father and mother.    ROS:  Please see the history of present illness.   Otherwise, review of systems are positive for palpitations.   All other systems are reviewed and negative.    PHYSICAL EXAM: VS:  BP 122/70   Pulse 88   Ht 5\' 7"  (1.702 m)   Wt 196 lb 3.2 oz (89 kg)   SpO2 98%   BMI 30.73 kg/m  , BMI Body mass index is 30.73 kg/m. GEN: Well nourished, well developed, in no acute distress  HEENT: normal  Neck: no JVD, carotid bruits, or masses Cardiac: RRR;  no murmurs, rubs, or gallops,no edema  Respiratory:  clear to auscultation bilaterally, normal work of breathing GI: soft, nontender, nondistended, + BS MS: no deformity or atrophy  Skin: warm and dry, no rash Neuro:  Strength and sensation are intact Psych: euthymic mood, full affect   EKG:   The ekg ordered in 12/21 demonstrates NSR, nonspecific ST changes   Recent Labs: 12/02/2019: ALT 12; TSH 1.22 12/21/2019: Hemoglobin 11.6; Platelets 363 01/01/2020: BUN 12; Creatinine, Ser 0.77; Potassium 4.2; Sodium 137   Lipid Panel    Component Value Date/Time   CHOL 190 12/02/2019 1436   TRIG 134 12/02/2019 1436   HDL 50 12/02/2019 1436   CHOLHDL 3.8 12/02/2019 1436   LDLCALC 115 (H) 12/02/2019 1436     Other studies Reviewed: Additional studies/ records that were reviewed today with results demonstrating: LDL  115.   ASSESSMENT AND PLAN:  1. Palpitations: 2 week Zio patch. Arrhythmia sounds benign as there is no syncope and has been going on for some time.  Need to r/o AFib with a monitor.  2. HTN: The current medical regimen is effective;  continue present plan and medications. 3. Family h/o early CAD: Plan for calcium scoring CT given family h/o CAD.  She is aware of the out of pocket cost.   4. COntinue regular exercise.  Let us know if she develops any sx with exercise.   She would like note sent to Dr. Johnsie Cancel.    Current medicines are reviewed at length with the patient today.  The patient concerns regarding her medicines were addressed.  The following changes have been made:  No change  Labs/ tests ordered today include:  No orders of the defined types were placed in this encounter.   Recommend 150 minutes/week of aerobic exercise Low fat, low carb, high fiber diet recommended  Disposition:   FU in based on Zio patch result   Signed, Larae Grooms, MD  02/25/2020 10:14 AM    [facility] [place], [place], [place]  [address] Phone: [phone]; Fax: [phone]

## 2020-02-25 ENCOUNTER — Ambulatory Visit (INDEPENDENT_AMBULATORY_CARE_PROVIDER_SITE_OTHER): Payer: 59 | Admitting: Interventional Cardiology

## 2020-02-25 ENCOUNTER — Ambulatory Visit (INDEPENDENT_AMBULATORY_CARE_PROVIDER_SITE_OTHER): Payer: 59

## 2020-02-25 ENCOUNTER — Encounter: Payer: Self-pay | Admitting: Interventional Cardiology

## 2020-02-25 ENCOUNTER — Encounter: Payer: Self-pay | Admitting: Radiology

## 2020-02-25 ENCOUNTER — Other Ambulatory Visit: Payer: Self-pay

## 2020-02-25 VITALS — BP 122/70 | HR 88 | Ht 67.0 in | Wt 196.2 lb

## 2020-02-25 DIAGNOSIS — R002 Palpitations: Secondary | ICD-10-CM

## 2020-02-25 DIAGNOSIS — Z8249 Family history of ischemic heart disease and other diseases of the circulatory system: Secondary | ICD-10-CM | POA: Diagnosis not present

## 2020-02-25 DIAGNOSIS — I10 Essential (primary) hypertension: Secondary | ICD-10-CM

## 2020-02-25 NOTE — Progress Notes (Signed)
Enrolled patient for a 14 day Zio XT Monitor to be mailed to patients home  

## 2020-02-25 NOTE — Patient Instructions (Signed)
Medication Instructions:  Your physician recommends that you continue on your current medications as directed. Please refer to the Current Medication list given to you today.  *If you need a refill on your cardiac medications before your next appointment, please call your pharmacy*   Lab Work: none If you have labs (blood work) drawn today and your tests are completely normal, you will receive your results only by: Marland Kitchen MyChart Message (if you have MyChart) OR . A paper copy in the mail If you have any lab test that is abnormal or we need to change your treatment, we will call you to review the results.   Testing/Procedures: Dr Irish Lack recommends you have a Calcium Score CT scan  Your physician has recommended that you wear a holter monitor. Holter monitors are medical devices that record the heart's electrical activity. Doctors most often use these monitors to diagnose arrhythmias. Arrhythmias are problems with the speed or rhythm of the heartbeat. The monitor is a small, portable device. You can wear one while you do your normal daily activities. This is usually used to diagnose what is causing palpitations/syncope (passing out). 2 week zio    Follow-Up: At [facility], you and your health needs are our priority.  As part of our continuing mission to provide you with exceptional heart care, we have created designated Provider Care Teams.  These Care Teams include your primary Cardiologist (physician) and Advanced Practice Providers (APPs -  Physician Assistants and Nurse Practitioners) who all work together to provide you with the care you need, when you need it.  We recommend signing up for the patient portal called "MyChart".  Sign up information is provided on this After Visit Summary.  MyChart is used to connect with patients for Virtual Visits (Telemedicine).  Patients are able to view lab/test results, encounter notes, upcoming appointments, etc.  Non-urgent messages can be sent to  your provider as well.   To learn more about what you can do with MyChart, go to [email or web address].    Your next appointment:   Based on results of monitor  The format for your next appointment:   In Person  Provider:   You may see Larae Grooms, MD or one of the following Advanced Practice Providers on your designated Care Team:    Melina Copa, PA-C  Ermalinda Barrios, PA-C    Other Instructions Bryn Gulling- Long Term Monitor Instructions   Your physician has requested you wear your ZIO patch monitor_14______days.   This is a single patch monitor.  [employer] supplies one patch monitor per enrollment.  Additional stickers are not available.   Please do not apply patch if you will be having a Nuclear Stress Test, Echocardiogram, Cardiac CT, MRI, or Chest Xray during the time frame you would be wearing the monitor. The patch cannot be worn during these tests.  You cannot remove and re-apply the ZIO XT patch monitor.   Your ZIO patch monitor will be sent USPS Priority mail from [facility] directly to your home address. The monitor may also be mailed to a PO BOX if home delivery is not available.   It may take 3-5 days to receive your monitor after you have been enrolled.   Once you have received you monitor, please review enclosed instructions.  Your monitor has already been registered assigning a specific monitor serial # to you.   Applying the monitor   Shave hair from upper left chest.   Hold abrader disc by orange tab.  Rub  abrader in 40 strokes over left upper chest as indicated in your monitor instructions.   Clean area with 4 enclosed alcohol pads .  Use all pads to assure are is cleaned thoroughly.  Let dry.   Apply patch as indicated in monitor instructions.  Patch will be place under collarbone on left side of chest with arrow pointing upward.   Rub patch adhesive wings for 2 minutes.Remove white label marked "1".  Remove white label marked "2".  Rub patch  adhesive wings for 2 additional minutes.   While looking in a mirror, press and release button in center of patch.  A small green light will flash 3-4 times .  This will be your only indicator the monitor has been turned on.     Do not shower for the first 24 hours.  You may shower after the first 24 hours.   Press button if you feel a symptom. You will hear a small click.  Record Date, Time and Symptom in the Patient Log Book.   When you are ready to remove patch, follow instructions on last 2 pages of Patient Log Book.  Stick patch monitor onto last page of Patient Log Book.   Place Patient Log Book in [place] box.  Use locking tab on box and tape box closed securely.  The [employer] and [employer] has [employer] on it.  Please place in mailbox as soon as possible.  Your physician should have your test results approximately 7 days after the monitor has been mailed back to [facility].   Call [place] at [phone] if you have questions regarding your ZIO XT patch monitor.  Call them immediately if you see an orange light blinking on your monitor.   If your monitor falls off in less than 4 days contact our Monitor department at [phone].  If your monitor becomes loose or falls off after 4 days call [employer] at [phone] for suggestions on securing your monitor.

## 2020-03-03 ENCOUNTER — Ambulatory Visit (INDEPENDENT_AMBULATORY_CARE_PROVIDER_SITE_OTHER)
Admission: RE | Admit: 2020-03-03 | Discharge: 2020-03-03 | Disposition: A | Discharge status: Home / Self Care | Payer: Self-pay | Source: Ambulatory Visit | Attending: Interventional Cardiology | Admitting: Interventional Cardiology

## 2020-03-03 ENCOUNTER — Other Ambulatory Visit: Payer: Self-pay

## 2020-03-03 DIAGNOSIS — R002 Palpitations: Secondary | ICD-10-CM

## 2020-03-03 DIAGNOSIS — I10 Essential (primary) hypertension: Secondary | ICD-10-CM

## 2020-03-05 ENCOUNTER — Telehealth: Payer: Self-pay | Admitting: *Deleted

## 2020-03-05 DIAGNOSIS — E785 Hyperlipidemia, unspecified: Secondary | ICD-10-CM

## 2020-03-05 MED ORDER — ROSUVASTATIN CALCIUM 10 MG PO TABS: 10.0000 mg | ORAL_TABLET | Freq: Every day | ORAL | 3 refills | Status: DC

## 2020-03-05 NOTE — Telephone Encounter (Signed)
-----   Message from Jettie Booze, MD sent at 03/04/2020  8:32 PM EST ----- Calcium score low, < 100, but for age she is in the 83rd percentile.  WOuld start low dose statin to get LDL < 100 to lower her risk going forward. Crestor 10 mg daily.  Lipids and liver test in 3 months.

## 2020-03-05 NOTE — Telephone Encounter (Signed)
Patient notified.  Prescription sent to [facility] on [address].  Patient will come in for fasting lab work on June 3,2022

## 2020-03-05 NOTE — Telephone Encounter (Signed)
Left message to call office

## 2020-03-20 ENCOUNTER — Other Ambulatory Visit: Payer: Self-pay | Admitting: Family Medicine

## 2020-03-26 ENCOUNTER — Telehealth: Payer: Self-pay | Admitting: Interventional Cardiology

## 2020-03-26 NOTE — Telephone Encounter (Signed)
Informed patient of results and verbal understanding expressed. Pt aware I will send this to Dr. Hassell Done nurse to address when pt should follow up

## 2020-03-26 NOTE — Telephone Encounter (Signed)
Patient is requesting to discuss her monitor results.

## 2020-03-27 NOTE — Telephone Encounter (Signed)
Per DPR it is OK to leave message on this voicemail.  Left message regarding follow up in a year.

## 2020-03-27 NOTE — Telephone Encounter (Signed)
Dr Irish Lack would like to see patient yearly

## 2020-03-28 ENCOUNTER — Ambulatory Visit (HOSPITAL_BASED_OUTPATIENT_CLINIC_OR_DEPARTMENT_OTHER): Payer: 59 | Attending: Family Medicine | Admitting: Internal Medicine

## 2020-03-28 VITALS — Ht 67.0 in | Wt 193.0 lb

## 2020-03-28 DIAGNOSIS — R0683 Snoring: Secondary | ICD-10-CM | POA: Diagnosis present

## 2020-03-28 DIAGNOSIS — I10 Essential (primary) hypertension: Secondary | ICD-10-CM

## 2020-04-04 DIAGNOSIS — I10 Essential (primary) hypertension: Secondary | ICD-10-CM

## 2020-04-04 NOTE — Procedures (Signed)
    Patient Name: Gail Lloyd, Gail Lloyd Date: 03/28/2020 Gender: Female D.O.B: Dec 29, 1965 Age (years): 49 Referring Provider: Grier Mitts MD Height (inches): 67 Interpreting Physician: Baird Lyons MD, ABSM Weight (lbs): 193 RPSGT: Earney [place] BMI: 30 MRN: [id number] Neck Size: 15.00  CLINICAL INFORMATION Sleep Study Type: NPSG Indication for sleep study: Snoring Epworth Sleepiness Score: 4  SLEEP STUDY TECHNIQUE As per the AASM Manual for the Scoring of Sleep and Associated Events v2.3 (April 2016) with a hypopnea requiring 4% desaturations.  The channels recorded and monitored were frontal, central and occipital EEG, electrooculogram (EOG), submentalis EMG (chin), nasal and oral airflow, thoracic and abdominal wall motion, anterior tibialis EMG, snore microphone, electrocardiogram, and pulse oximetry.  MEDICATIONS Medications self-administered by patient taken the night of the study : CARVEDILOL, ROSUVERSTATIN, LOSARTAN, BENEDRYL [place] The study was initiated at 9:27:48 PM and ended at 4:25:01 AM.  Sleep onset time was 31.9 minutes and the sleep efficiency was 87.0%%. The total sleep time was 363 minutes.  Stage REM latency was 78.5 minutes.  The patient spent 1.9%% of the night in stage N1 sleep, 75.2%% in stage N2 sleep, 2.1%% in stage N3 and 20.8% in REM.  Alpha intrusion was absent.  Supine sleep was 100.00%.  RESPIRATORY PARAMETERS The overall apnea/hypopnea index (AHI) was 4.1 per hour. There were 2 total apneas, including 2 obstructive, 0 central and 0 mixed apneas. There were 23 hypopneas and 6 RERAs.  The AHI during Stage REM sleep was 7.9 per hour.  AHI while supine was 4.1 per hour.  The mean oxygen saturation was 90.9%. The minimum SpO2 during sleep was 80.0%.  moderate snoring was noted during this study.  CARDIAC DATA The 2 lead EKG demonstrated sinus rhythm. The mean heart rate was 69.7 beats per minute.  Other EKG findings include: None.  LEG MOVEMENT DATA The total PLMS were 0 with a resulting PLMS index of 0.0. Associated arousal with leg movement index was 1.7 .  IMPRESSIONS - No significant obstructive sleep apnea occurred during this study (AHI = 4.1/h). - Moderate oxygen desaturation was noted during this study (Min O2 = 80.0%). Mean O2 saturation 90.9% - The patient snored with moderate snoring volume. - No cardiac abnormalities were noted during this study. - Clinically significant periodic limb movements did not occur during sleep. No significant associated arousals.  DIAGNOSIS - Primary Snoring  RECOMMENDATIONS - Manage for snoring and symptoms based on clinical judgment. - Be careful with alcohol, sedatives and other CNS depressants that may worsen sleep apnea and disrupt normal sleep architecture. - Sleep hygiene should be reviewed to assess factors that may improve sleep quality. - Weight management and regular exercise should be initiated or continued if appropriate.  [Electronically signed] 04/04/2020 11:15 AM  Baird Lyons MD, ABSM Diplomate, American Board of Sleep Medicine   NPI: [id number]                        [place]ny [place], [facility]  ELECTRONICALLY SIGNED ON:  04/04/2020, 11:11 AM [place] PH: [phone]   FX: [phone] [place]

## 2020-04-09 ENCOUNTER — Ambulatory Visit: Payer: 59 | Admitting: Family Medicine

## 2020-04-09 ENCOUNTER — Encounter: Payer: Self-pay | Admitting: Family Medicine

## 2020-04-09 ENCOUNTER — Other Ambulatory Visit: Payer: Self-pay

## 2020-04-09 VITALS — BP 128/80 | HR 86 | Temp 98.8°F | Wt 187.4 lb

## 2020-04-09 DIAGNOSIS — E7841 Elevated Lipoprotein(a): Secondary | ICD-10-CM | POA: Diagnosis not present

## 2020-04-09 DIAGNOSIS — R7303 Prediabetes: Secondary | ICD-10-CM | POA: Diagnosis not present

## 2020-04-09 DIAGNOSIS — R0683 Snoring: Secondary | ICD-10-CM

## 2020-04-09 DIAGNOSIS — I10 Essential (primary) hypertension: Secondary | ICD-10-CM

## 2020-04-09 NOTE — Progress Notes (Signed)
Subjective:    Patient ID: Gail Lloyd, female    DOB: Mar 30, 1965, 55 y.o.   MRN: [id number]  Chief Complaint  Patient presents with  . Follow-up    HPI Patient is a 55 year old female with past medical history significant for HTN, global lesion, pre-DM, snoring, HLD who was seen today for f/u.  Pt had a sleep study a few wks ago.  Seen by Cardiology, wore heart monitor for h/o palpitations.  Started on crestor 10 mg.  Pt denies myalgias.  Making diet changes, has lost 6 lbs or more.  Past Medical History:  Diagnosis Date  . Heart murmur     Allergies  Allergen Reactions  . Penicillins Hives and Itching  . Sudafed [Pseudoephedrine Hcl] Palpitations    ROS General: Denies fever, chills, night sweats, changes in weight, changes in appetite HEENT: Denies headaches, ear pain, changes in vision, rhinorrhea, sore throat CV: Denies CP, palpitations, SOB, orthopnea Pulm: Denies SOB, cough, wheezing GI: Denies abdominal pain, nausea, vomiting, diarrhea, constipation GU: Denies dysuria, hematuria, frequency, vaginal discharge Msk: Denies muscle cramps, joint pains Neuro: Denies weakness, numbness, tingling Skin: Denies rashes, bruising Psych: Denies depression, anxiety, hallucinations     Objective:    Blood pressure 128/80, pulse 86, temperature 98.8 F (37.1 C), temperature source Oral, weight 187 lb 6.4 oz (85 kg), SpO2 97 %.  Gen. Pleasant, well-nourished, in no distress, normal affect   HEENT: [place]/AT, face symmetric, conjunctiva clear, no scleral icterus, PERRLA, EOMI, nares patent without drainage Lungs: no accessory muscle use, CTAB, no wheezes or rales Cardiovascular: RRR, no m/r/g, no peripheral edema Musculoskeletal: No deformities, no cyanosis or clubbing, normal tone Neuro:  A&Ox3, CN II-XII intact, normal gait Skin:  Warm, no lesions/ rash  Wt Readings from Last 3 Encounters:  04/09/20 187 lb 6.4 oz (85 kg)  03/28/20 193 lb (87.5 kg)  02/25/20 196 lb 3.2 oz (89 kg)     Lab Results  Component Value Date   WBC 8.9 12/21/2019   HGB 11.6 (L) 12/21/2019   HCT 34.9 (L) 12/21/2019   PLT 363 12/21/2019   GLUCOSE 96 01/01/2020   CHOL 190 12/02/2019   TRIG 134 12/02/2019   HDL 50 12/02/2019   LDLCALC 115 (H) 12/02/2019   ALT 12 12/02/2019   AST 9 (L) 12/02/2019   NA 137 01/01/2020   K 4.2 01/01/2020   CL 100 01/01/2020   CREATININE 0.77 01/01/2020   BUN 12 01/01/2020   CO2 29 01/01/2020   TSH 1.22 12/02/2019   HGBA1C 6.3 (H) 12/02/2019    Assessment/Plan:  Essential hypertension -Controlled -Continue losartan 50 mg, Coreg 6.25 mg twice daily, Norvasc 5 mg -Continue lifestyle modifications  Primary snoring -Discussed sleep hygiene -Continue lifestyle medications including weight loss  Elevated lipoprotein(a) -Lifestyle modifications -Continue Crestor 10 mg -Repeat lipids and CMP in 3 months.  Orders placed by cardiology.  Prediabetes -Hemoglobin A1c 6.3% on 12/02/2019 -Continue lifestyle modifications  F/u 4-6 months, sooner if needed  Grier Mitts, MD

## 2020-04-22 ENCOUNTER — Other Ambulatory Visit: Payer: Self-pay | Admitting: Family Medicine

## 2020-04-30 ENCOUNTER — Other Ambulatory Visit: Payer: Self-pay

## 2020-04-30 ENCOUNTER — Ambulatory Visit: Payer: 59 | Admitting: Family Medicine

## 2020-04-30 ENCOUNTER — Encounter: Payer: Self-pay | Admitting: Family Medicine

## 2020-04-30 VITALS — BP 106/66 | HR 78 | Temp 98.5°F | Wt 190.2 lb

## 2020-04-30 DIAGNOSIS — F419 Anxiety disorder, unspecified: Secondary | ICD-10-CM

## 2020-04-30 DIAGNOSIS — R002 Palpitations: Secondary | ICD-10-CM

## 2020-04-30 DIAGNOSIS — I10 Essential (primary) hypertension: Secondary | ICD-10-CM | POA: Diagnosis not present

## 2020-04-30 DIAGNOSIS — H6982 Other specified disorders of Eustachian tube, left ear: Secondary | ICD-10-CM

## 2020-04-30 NOTE — Patient Instructions (Signed)
Palpitations Palpitations are feelings that your heartbeat is irregular or is faster than normal. It may feel like your heart is fluttering or skipping a beat. Palpitations are usually not a serious problem. They may be caused by many things, including smoking, caffeine, alcohol, stress, and certain medicines or drugs. Most causes of palpitations are not serious. However, some palpitations can be a sign of a serious problem. You may need further tests to rule out serious medical problems. Follow these instructions at home: Pay attention to any changes in your condition. Take these actions to help manage your symptoms: Eating and drinking  Avoid foods and drinks that may cause palpitations. These may include: ? Caffeinated coffee, tea, soft drinks, diet pills, and energy drinks. ? Chocolate. ? Alcohol. Lifestyle  Take steps to reduce your stress and anxiety. Things that can help you relax include: ? Yoga. ? Mind-body activities, such as deep breathing, meditation, or using words and images to create positive thoughts (guided imagery). ? Physical activity, such as swimming, jogging, or walking. Tell your health care provider if your palpitations increase with activity. If you have chest pain or shortness of breath with activity, do not continue the activity until you are seen by your health care provider. ? Biofeedback. This is a method that helps you learn to use your mind to control things in your body, such as your heartbeat.  Do not use drugs, including cocaine or ecstasy. Do not use marijuana.  Get plenty of rest and sleep. Keep a regular bed time. General instructions  Take over-the-counter and prescription medicines only as told by your health care provider.  Do not use any products that contain nicotine or tobacco, such as cigarettes and e-cigarettes. If you need help quitting, ask your health care provider.  Keep all follow-up visits as told by your health care provider. This is  important. These may include visits for further testing if palpitations do not go away or get worse.      Contact a health care provider if you:  Continue to have a fast or irregular heartbeat after 24 hours.  Notice that your palpitations occur more often. Get help right away if you:  Have chest pain or shortness of breath.  Have a severe headache.  Feel dizzy or you faint. Summary  Palpitations are feelings that your heartbeat is irregular or is faster than normal. It may feel like your heart is fluttering or skipping a beat.  Palpitations may be caused by many things, including smoking, caffeine, alcohol, stress, certain medicines, and drugs.  Although most causes of palpitations are not serious, some causes can be a sign of a serious medical problem.  Get help right away if you faint or have chest pain, shortness of breath, a severe headache, or dizziness. This information is not intended to replace advice given to you by your health care provider. Make sure you discuss any questions you have with your health care provider. Document Revised: 02/01/2017 Document Reviewed: 02/01/2017 Elsevier Patient Education  2021 [place].  Managing Your Hypertension Hypertension, also called high blood pressure, is when the force of the blood pressing against the walls of the arteries is too strong. Arteries are blood vessels that carry blood from your heart throughout your body. Hypertension forces the heart to work harder to pump blood and may cause the arteries to become narrow or stiff. Understanding blood pressure readings Your personal target blood pressure may vary depending on your medical conditions, your age, and other factors.  A blood pressure reading includes a higher number over a lower number. Ideally, your blood pressure should be below 120/80. You should know that:  The first, or top, number is called the systolic pressure. It is a measure of the pressure in your arteries  as your heart beats.  The second, or bottom number, is called the diastolic pressure. It is a measure of the pressure in your arteries as the heart relaxes. Blood pressure is classified into four stages. Based on your blood pressure reading, your health care provider may use the following stages to determine what type of treatment you need, if any. Systolic pressure and diastolic pressure are measured in a unit called mmHg. Normal  Systolic pressure: below 696.  Diastolic pressure: below 80. Elevated  Systolic pressure: 789-381.  Diastolic pressure: below 80. Hypertension stage 1  Systolic pressure: 017-510.  Diastolic pressure: 25-85. Hypertension stage 2  Systolic pressure: 277 or above.  Diastolic pressure: 90 or above. How can this condition affect me? Managing your hypertension is an important responsibility. Over time, hypertension can damage the arteries and decrease blood flow to important parts of the body, including the brain, heart, and kidneys. Having untreated or uncontrolled hypertension can lead to:  A heart attack.  A stroke.  A weakened blood vessel (aneurysm).  Heart failure.  Kidney damage.  Eye damage.  Metabolic syndrome.  Memory and concentration problems.  Vascular dementia. What actions can I take to manage this condition? Hypertension can be managed by making lifestyle changes and possibly by taking medicines. Your health care provider will help you make a plan to bring your blood pressure within a normal range. Nutrition  Eat a diet that is high in fiber and potassium, and low in salt (sodium), added sugar, and fat. An example eating plan is called the Dietary Approaches to Stop Hypertension (DASH) diet. To eat this way: ? Eat plenty of fresh fruits and vegetables. Try to fill one-half of your plate at each meal with fruits and vegetables. ? Eat whole grains, such as whole-wheat pasta, brown rice, or whole-grain bread. Fill about one-fourth  of your plate with whole grains. ? Eat low-fat dairy products. ? Avoid fatty cuts of meat, processed or cured meats, and poultry with skin. Fill about one-fourth of your plate with lean proteins such as fish, chicken without skin, beans, eggs, and tofu. ? Avoid pre-made and processed foods. These tend to be higher in sodium, added sugar, and fat.  Reduce your daily sodium intake. Most people with hypertension should eat less than 1,500 mg of sodium a day.   Lifestyle  Work with your health care provider to maintain a healthy body weight or to lose weight. Ask what an ideal weight is for you.  Get at least 30 minutes of exercise that causes your heart to beat faster (aerobic exercise) most days of the week. Activities may include walking, swimming, or biking.  Include exercise to strengthen your muscles (resistance exercise), such as weight lifting, as part of your weekly exercise routine. Try to do these types of exercises for 30 minutes at least 3 days a week.  Do not use any products that contain nicotine or tobacco, such as cigarettes, e-cigarettes, and chewing tobacco. If you need help quitting, ask your health care provider.  Control any long-term (chronic) conditions you have, such as high cholesterol or diabetes.  Identify your sources of stress and find ways to manage stress. This may include meditation, deep breathing, or making time for  fun activities.   Alcohol use  Do not drink alcohol if: ? Your health care provider tells you not to drink. ? You are pregnant, may be pregnant, or are planning to become pregnant.  If you drink alcohol: ? Limit how much you use to:  0-1 drink a day for women.  0-2 drinks a day for men. ? Be aware of how much alcohol is in your drink. In the [place] one drink equals one 12 oz bottle of beer (355 mL), one 5 oz glass of wine (148 mL), or one 1 oz glass of hard liquor (44 mL). Medicines Your health care provider may prescribe medicine if  lifestyle changes are not enough to get your blood pressure under control and if:  Your systolic blood pressure is 130 or higher.  Your diastolic blood pressure is 80 or higher. Take medicines only as told by your health care provider. Follow the directions carefully. Blood pressure medicines must be taken as told by your health care provider. The medicine does not work as well when you skip doses. Skipping doses also puts you at risk for problems. Monitoring Before you monitor your blood pressure:  Do not smoke, drink caffeinated beverages, or exercise within 30 minutes before taking a measurement.  Use the bathroom and empty your bladder (urinate).  Sit quietly for at least 5 minutes before taking measurements. Monitor your blood pressure at home as told by your health care provider. To do this:  Sit with your back straight and supported.  Place your feet flat on the floor. Do not cross your legs.  Support your arm on a flat surface, such as a table. Make sure your upper arm is at heart level.  Each time you measure, take two or three readings one minute apart and record the results. You may also need to have your blood pressure checked regularly by your health care provider.   General information  Talk with your health care provider about your diet, exercise habits, and other lifestyle factors that may be contributing to hypertension.  Review all the medicines you take with your health care provider because there may be side effects or interactions.  Keep all visits as told by your health care provider. Your health care provider can help you create and adjust your plan for managing your high blood pressure. Where to find more information  National Heart, Lung, and Blood Institute: https://wilson-eaton.com/  American Heart Association: www.heart.org Contact a health care provider if:  You think you are having a reaction to medicines you have taken.  You have repeated (recurrent)  headaches.  You feel dizzy.  You have swelling in your ankles.  You have trouble with your vision. Get help right away if:  You develop a severe headache or confusion.  You have unusual weakness or numbness, or you feel faint.  You have severe pain in your chest or abdomen.  You vomit repeatedly.  You have trouble breathing. These symptoms may represent a serious problem that is an emergency. Do not wait to see if the symptoms will go away. Get medical help right away. Call your local emergency services (911 in the [place] Do not drive yourself to the hospital. Summary  Hypertension is when the force of blood pumping through your arteries is too strong. If this condition is not controlled, it may put you at risk for serious complications.  Your personal target blood pressure may vary depending on your medical conditions, your age, and other factors. For  most people, a normal blood pressure is less than 120/80.  Hypertension is managed by lifestyle changes, medicines, or both.  Lifestyle changes to help manage hypertension include losing weight, eating a healthy, low-sodium diet, exercising more, stopping smoking, and limiting alcohol. This information is not intended to replace advice given to you by your health care provider. Make sure you discuss any questions you have with your health care provider. Document Revised: 01/25/2019 Document Reviewed: 11/20/2018 Elsevier Patient Education  2021 [place], Adult After being diagnosed with an anxiety disorder, you may be relieved to know why you have felt or behaved a certain way. You may also feel overwhelmed about the treatment ahead and what it will mean for your life. With care and support, you can manage this condition and recover from it. How to manage lifestyle changes Managing stress and anxiety Stress is your body's reaction to life changes and events, both good and bad. Most stress will last just a few  hours, but stress can be ongoing and can lead to more than just stress. Although stress can play a major role in anxiety, it is not the same as anxiety. Stress is usually caused by something external, such as a deadline, test, or competition. Stress normally passes after the triggering event has ended.  Anxiety is caused by something internal, such as imagining a terrible outcome or worrying that something will go wrong that will devastate you. Anxiety often does not go away even after the triggering event is over, and it can become long-term (chronic) worry. It is important to understand the differences between stress and anxiety and to manage your stress effectively so that it does not lead to an anxious response. Talk with your health care provider or a counselor to learn more about reducing anxiety and stress. He or she may suggest tension reduction techniques, such as:  Music therapy. This can include creating or listening to music that you enjoy and that inspires you.  Mindfulness-based meditation. This involves being aware of your normal breaths while not trying to control your breathing. It can be done while sitting or walking.  Centering prayer. This involves focusing on a word, phrase, or sacred image that means something to you and brings you peace.  Deep breathing. To do this, expand your stomach and inhale slowly through your nose. Hold your breath for 3-5 seconds. Then exhale slowly, letting your stomach muscles relax.  Self-talk. This involves identifying thought patterns that lead to anxiety reactions and changing those patterns.  Muscle relaxation. This involves tensing muscles and then relaxing them. Choose a tension reduction technique that suits your lifestyle and personality. These techniques take time and practice. Set aside 5-15 minutes a day to do them. Therapists can offer counseling and training in these techniques. The training to help with anxiety may be covered by some  insurance plans. Other things you can do to manage stress and anxiety include:  Keeping a stress/anxiety diary. This can help you learn what triggers your reaction and then learn ways to manage your response.  Thinking about how you react to certain situations. You may not be able to control everything, but you can control your response.  Making time for activities that help you relax and not feeling guilty about spending your time in this way.  Visual imagery and yoga can help you stay calm and relax.   Medicines Medicines can help ease symptoms. Medicines for anxiety include:  Anti-anxiety drugs.  Antidepressants. Medicines are  often used as a primary treatment for anxiety disorder. Medicines will be prescribed by a health care provider. When used together, medicines, psychotherapy, and tension reduction techniques may be the most effective treatment. Relationships Relationships can play a big part in helping you recover. Try to spend more time connecting with trusted friends and family members. Consider going to couples counseling, taking family education classes, or going to family therapy. Therapy can help you and others better understand your condition. How to recognize changes in your anxiety Everyone responds differently to treatment for anxiety. Recovery from anxiety happens when symptoms decrease and stop interfering with your daily activities at home or work. This may mean that you will start to:  Have better concentration and focus. Worry will interfere less in your daily thinking.  Sleep better.  Be less irritable.  Have more energy.  Have improved memory. It is important to recognize when your condition is getting worse. Contact your health care provider if your symptoms interfere with home or work and you feel like your condition is not improving. Follow these instructions at home: Activity  Exercise. Most adults should do the following: ? Exercise for at least 150  minutes each week. The exercise should increase your heart rate and make you sweat (moderate-intensity exercise). ? Strengthening exercises at least twice a week.  Get the right amount and quality of sleep. Most adults need 7-9 hours of sleep each night. Lifestyle  Eat a healthy diet that includes plenty of vegetables, fruits, whole grains, low-fat dairy products, and lean protein. Do not eat a lot of foods that are high in solid fats, added sugars, or salt.  Make choices that simplify your life.  Do not use any products that contain nicotine or tobacco, such as cigarettes, e-cigarettes, and chewing tobacco. If you need help quitting, ask your health care provider.  Avoid caffeine, alcohol, and certain over-the-counter cold medicines. These may make you feel worse. Ask your pharmacist which medicines to avoid.   General instructions  Take over-the-counter and prescription medicines only as told by your health care provider.  Keep all follow-up visits as told by your health care provider. This is important. Where to find support You can get help and support from these sources:  Self-help groups.  Online and [employer].  A trusted spiritual leader.  Couples counseling.  Family education classes.  Family therapy. Where to find more information You may find that joining a support group helps you deal with your anxiety. The following sources can help you locate counselors or support groups near you:  [place]: www.mentalhealthamerica.net  Anxiety and Depression Association of [place] (ADAA): https://www.clark.net/  National Alliance on Mental Illness (NAMI): www.nami.org Contact a health care provider if you:  Have a hard time staying focused or finishing daily tasks.  Spend many hours a day feeling worried about everyday life.  Become exhausted by worry.  Start to have headaches, feel tense, or have nausea.  Urinate more than normal.  Have  diarrhea. Get help right away if you have:  A racing heart and shortness of breath.  Thoughts of hurting yourself or others. If you ever feel like you may hurt yourself or others, or have thoughts about taking your own life, get help right away. You can go to your nearest emergency department or call:  Your local emergency services (911 in the [place]  A suicide crisis helpline, such as the [place] at 431 105 5611. This is open 24 hours a day.  Summary  Taking steps to learn and use tension reduction techniques can help calm you and help prevent triggering an anxiety reaction.  When used together, medicines, psychotherapy, and tension reduction techniques may be the most effective treatment.  Family, friends, and partners can play a big part in helping you recover from an anxiety disorder. This information is not intended to replace advice given to you by your health care provider. Make sure you discuss any questions you have with your health care provider. Document Revised: 05/22/2018 Document Reviewed: 05/22/2018 Elsevier Patient Education  [place].

## 2020-04-30 NOTE — Progress Notes (Signed)
Subjective:    Patient ID: Gail Lloyd, female    DOB: [birth year], 55 y.o.   MRN: [id number]  Chief Complaint  Patient presents with  . Hypertension    HPI Patient was seen today for follow-up on blood pressure.  Patient states BP at home slightly elevated with diastolic 45O-59Y per memory with heart rate 98-107.  Taking Norvasc 5 mg, Coreg 6.25 mg twice daily and losartan 50 mg daily.  Patient endorses palpitations.  May happen at any time, often while driving or at bedtime.  Patient also notes blurred vision and increased anxiety.  Patient thinks symptoms may be related to anxiety from her near syncopal episode while driving.  Trying to eat regular meals and snacks daily.  Drinking mostly water.  May have a "clear" uncaffeinated soda.  Postprandial blood sugar on 2 occasions 120 and 150.  Patient does not drink caffeine, alcohol, or spicy foods. Previously seen by cardiology.  Event monitor largely negative.  Patient started on Crestor 10 mg after cardiac calcium scoring.  Patient also mentions left ear feeling warm.  Past Medical History:  Diagnosis Date  . Heart murmur     Allergies  Allergen Reactions  . Penicillins Hives and Itching  . Sudafed [Pseudoephedrine Hcl] Palpitations    ROS General: Denies fever, chills, night sweats, changes in weight, changes in appetite HEENT: Denies headaches, ear pain, changes in vision, rhinorrhea, sore throat  + warmth of left ear. CV: Denies CP, SOB, orthopnea + palpitations Pulm: Denies SOB, cough, wheezing GI: Denies abdominal pain, nausea, vomiting, diarrhea, constipation GU: Denies dysuria, hematuria, frequency, vaginal discharge Msk: Denies muscle cramps, joint pains Neuro: Denies weakness, numbness, tingling Skin: Denies rashes, bruising Psych: Denies depression, hallucinations  + anxiety    Objective:    Blood pressure 106/66, pulse 78, temperature 98.5 F (36.9 C), temperature source Oral, weight 190 lb 3.2 oz (86.3 kg), SpO2 98  %.  Gen. Pleasant, well-nourished, in no distress, normal affect   HEENT: [place]/AT, face symmetric, conjunctiva clear, no scleral icterus, PERRLA, EOMI, nares patent without drainage, bilateral external ears, canals, and TMs normal bilaterally Neck: No JVD, no thyromegaly, no carotid bruits. Lungs: no accessory muscle use, CTAB, no wheezes or rales Cardiovascular: RRR, no m/r/g, no peripheral edema.  EKG obtained. Musculoskeletal: No deformities, no cyanosis or clubbing, normal tone Neuro:  A&Ox3, CN II-XII intact, normal gait Skin:  Warm, no lesions/ rash   Wt Readings from Last 3 Encounters:  04/30/20 190 lb 3.2 oz (86.3 kg)  04/09/20 187 lb 6.4 oz (85 kg)  03/28/20 193 lb (87.5 kg)    Lab Results  Component Value Date   WBC 8.9 12/21/2019   HGB 11.6 (L) 12/21/2019   HCT 34.9 (L) 12/21/2019   PLT 363 12/21/2019   GLUCOSE 96 01/01/2020   CHOL 190 12/02/2019   TRIG 134 12/02/2019   HDL 50 12/02/2019   LDLCALC 115 (H) 12/02/2019   ALT 12 12/02/2019   AST 9 (L) 12/02/2019   NA 137 01/01/2020   K 4.2 01/01/2020   CL 100 01/01/2020   CREATININE 0.77 01/01/2020   BUN 12 01/01/2020   CO2 29 01/01/2020   TSH 1.22 12/02/2019   HGBA1C 6.3 (H) 12/02/2019    Assessment/Plan:  Palpitations -Discussed possible causes including anxiety -Labs reviewed from 12/21/2019 and 12/02/2019 -EKG this visit with NSR: HR 74 RR' waves in lead III and aVF.  Previous EKGs used for comparison -For continued symptoms discussed repeating event monitor and follow-up with cardiology -  Plan: EKG 12-Lead  Essential hypertension -Controlled this visit -Discussed possible causes of elevation at home including increased anxiety, increased sodium intake, etc. -Continue current medications including Norvasc 5 mg, Coreg 6.25 mg twice daily, losartan 50 mg daily -Continue lifestyle modifications -For continued elevation will adjust current medications -Given precautions  Dysfunction of left eustachian  tube -Likely 2/2 allergies due to increased pollen in the area -Discussed OTC antihistamines -Patient to use Flonase.  Has some at home from previous prescription  Anxiety -Increased -Discussed counseling.  Patient to contact area providers -We will continue to monitor  F/u as needed  Grier Mitts, MD

## 2020-05-24 ENCOUNTER — Other Ambulatory Visit: Payer: Self-pay

## 2020-05-24 ENCOUNTER — Ambulatory Visit (HOSPITAL_COMMUNITY)
Admission: EM | Admit: 2020-05-24 | Discharge: 2020-05-24 | Disposition: A | Discharge status: Home / Self Care | Payer: 59 | Attending: Physician Assistant | Admitting: Physician Assistant

## 2020-05-24 ENCOUNTER — Encounter (HOSPITAL_COMMUNITY): Payer: Self-pay | Admitting: *Deleted

## 2020-05-24 DIAGNOSIS — M79605 Pain in left leg: Secondary | ICD-10-CM | POA: Diagnosis not present

## 2020-05-24 DIAGNOSIS — M25562 Pain in left knee: Secondary | ICD-10-CM | POA: Diagnosis not present

## 2020-05-24 DIAGNOSIS — M7989 Other specified soft tissue disorders: Secondary | ICD-10-CM

## 2020-05-24 MED ORDER — NAPROXEN 375 MG PO TABS: 375.0000 mg | ORAL_TABLET | Freq: Two times a day (BID) | ORAL | 0 refills | Status: AC

## 2020-05-24 NOTE — Discharge Instructions (Signed)
It is very important that you go get your ultrasound tomorrow.  If you have any chest pain or shortness of breath overnight you need to go to the hospital.  Start Naprosyn twice daily for pain relief.  Do not take additional NSAIDs (aspirin, ibuprofen/Advil, naproxen/Aleve) with this medication.  Try to keep your leg elevated and use heat for symptom relief.  If anything worsens please return for reevaluation.  Follow-up with sports medicine if you do not have a blood clot and your symptoms continue.

## 2020-05-24 NOTE — ED Provider Notes (Signed)
[facility]    CSN: [id number] Arrival date & time: 05/24/20  1621      History   Chief Complaint Chief Complaint  Patient presents with  . Knee Pain  . Leg Pain    HPI Gail Lloyd is a 55 y.o. female.   Patient presents today with a 2-day history of worsening left knee pain.  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  This is gradually been worsening and has become unbearable over the last 24 hours.  Currently pain is rated 10 on a 0-10 pain scale, localized to posterior left knee with radiation into calf, described as throbbing, worse with flexion, no alleviating factors identified.  She has tried Tylenol and ibuprofen without improvement of symptoms.  Denies previous knee injury or surgery.  She does report some instability as when she twists she feels she may fall down.  She denies history of DVT or family history of VTE events.  She does not take exogenous hormones and denies any recent hospitalization, immobilization, surgical procedure.  She denies any chest pain or shortness of breath.  She is unable to perform daily activities as result of symptoms.     Past Medical History:  Diagnosis Date  . Heart murmur     Patient Active Problem List   Diagnosis Date Noted  . 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 Global amnesia 12/13/2019  . Prediabetes 10/10/2019  . Essential hypertension 10/10/2019  . Primary snoring 10/10/2019    Past Surgical History:  Procedure Laterality Date  . WISDOM TOOTH EXTRACTION      OB History    Gravida  1   Para      Term      Preterm      AB      Living  1     SAB      IAB      Ectopic      Multiple      Live Births               Home Medications    Prior to Admission medications   Medication Sig Start Date End Date Taking? Authorizing Provider  naproxen (NAPROSYN) 375 MG tablet Take 1 tablet (375 mg total) by  mouth 2 (two) times daily for 5 days. 05/24/20 05/29/20 Yes Vann Okerlund K, PA-C  amLODipine (NORVASC) 5 MG tablet TAKE 1 TABLET(5 MG) BY MOUTH DAILY 04/22/20   Billie Ruddy, MD  Ascorbic Acid (VITAMIN C) 1000 MG tablet Take 1,000 mg by mouth daily.    [provider]  carvedilol (COREG) 6.25 MG tablet TAKE 1 TABLET (6.25 MG TOTAL) BY MOUTH 2 (TWO) TIMES DAILY WITH A MEAL. 02/24/20   Billie Ruddy, MD  diphenhydrAMINE (BENADRYL) 25 MG tablet Take 25 mg by mouth every 6 (six) hours as needed for allergies.    [provider]  fluticasone (FLONASE) 50 MCG/ACT nasal spray SHAKE LIQUID AND USE 1 SPRAY IN EACH NOSTRIL DAILY 01/01/20   Billie Ruddy, MD  ipratropium (ATROVENT) 0.06 % nasal spray Place 2 sprays into both nostrils 4 (four) times daily. 02/23/17   Tasia Catchings, Amy V, PA-C  losartan (COZAAR) 50 MG tablet TAKE 1 TABLET(50 MG) BY MOUTH DAILY 03/27/20   Billie Ruddy, MD  melatonin 5 MG TABS Take 5 mg by mouth daily as needed (sleep).    [provider]  Multiple Vitamin (MULTIVITAMIN WITH MINERALS) TABS tablet Take 1 tablet by mouth daily.    [provider]  rosuvastatin (CRESTOR) 10 MG tablet Take 1 tablet (10 mg total) by mouth daily. 03/05/20   Jettie Booze, MD    Family History Family History  Problem Relation Age of Onset  . Diabetes Mother   . Hypertension Mother   . Hypertension Father   . Cancer Father     Social History Social History   Tobacco Use  . Smoking status: Never Smoker  . Smokeless tobacco: Never Used  Vaping Use  . Vaping Use: Never used  Substance Use Topics  . Alcohol use: No  . Drug use: No     Allergies   Penicillins and Sudafed [pseudoephedrine hcl]   Review of Systems Review of Systems  Constitutional: Positive for activity change. Negative for appetite change, fatigue and fever.  Respiratory: Negative for cough and shortness of breath.   Cardiovascular: Negative for chest pain, palpitations and leg  swelling.  Gastrointestinal: Negative for abdominal pain, diarrhea, nausea and vomiting.  Musculoskeletal: Positive for arthralgias, gait problem and myalgias.  Skin: Negative for color change and wound.  Neurological: Negative for dizziness, weakness, light-headedness, numbness and headaches.     Physical Exam Triage Vital Signs ED Triage Vitals  Enc Vitals Group     BP 05/24/20 1718 (!) 143/79     Pulse Rate 05/24/20 1718 81     Resp --      Temp 05/24/20 1718 98.1 F (36.7 C)     Temp src --      SpO2 05/24/20 1718 95 %     Weight --      Height --      Head Circumference --      Peak Flow --      Pain Score 05/24/20 1720 10     Pain Loc --      Pain Edu? --      Excl. in [place]? --    No data found.  Updated Vital Signs BP (!) 143/79   Pulse 81   Temp 98.1 F (36.7 C)   SpO2 95%   Visual Acuity Right Eye Distance:   Left Eye Distance:   Bilateral Distance:    Right Eye Near:   Left Eye Near:    Bilateral Near:     Physical Exam Vitals reviewed.  Constitutional:      General: She is awake. She is not in acute distress.    Appearance: Normal appearance. She is not ill-appearing.     Comments: Very pleasant female appears stated age in no acute distress  HENT:     Head: Normocephalic and atraumatic.  Cardiovascular:     Rate and Rhythm: Normal rate and regular rhythm.     Pulses:          Posterior tibial pulses are 2+ on the right side and 2+ on the left side.     Heart sounds: No murmur heard.     Comments: Negative Homans' sign Pulmonary:     Effort: Pulmonary effort is normal.     Breath sounds: Normal breath sounds. No wheezing, rhonchi or rales.     Comments: Clear to auscultation bilaterally Abdominal:     Palpations: Abdomen is soft.     Tenderness: There is no abdominal tenderness.  Musculoskeletal:     Left knee: Swelling present. No deformity or bony tenderness. Decreased range of motion. Tenderness present over  the medial joint line and  lateral joint line. No LCL laxity, MCL laxity, ACL laxity or PCL laxity.    Instability Tests: Medial McMurray test negative and lateral McMurray test negative.     Right lower leg: No tenderness or bony tenderness. No edema.     Left lower leg: Tenderness present. No bony tenderness. No edema.     Comments: Left knee: Significant tenderness palpation at medial and lateral joint line.  No ligamentous laxity on exam.  Negative McMurray.  Mild swelling noted.  Foot neurovascularly intact.  Left calf measures 35 cm and right calf measures 35 cm.  No cords palpable.  Psychiatric:        Behavior: Behavior is cooperative.      UC Treatments / Results  Labs (all labs ordered are listed, but only abnormal results are displayed) Labs Reviewed - No data to display  EKG   Radiology No results found.  Procedures Procedures (including critical care time)  Medications Ordered in UC Medications - No data to display  Initial Impression / Assessment and Plan / UC Course  I have reviewed the triage vital signs and the nursing notes.  Pertinent labs & imaging results that were available during my care of the patient were reviewed by me and considered in my medical decision making (see chart for details).     Low suspicion of DVT but given unilateral throbbing pain and patient's concern will order venous ultrasound to be obtained outpatient tomorrow.  Discussed that if she has any worsening symptoms including chest pain or shortness of breath she is to go to the emergency room.  No indication for x-ray given lack of traumatic injury and no bony tenderness or deformity on exam.  Concern for meniscal injury patient was given contact information for sports medicine to follow-up if symptoms persist.  Recommend she rest and keep leg elevated.  She was prescribed Naprosyn for pain relief with instruction not to take additional NSAIDs with this medication.  Strict return precautions given to which patient  expressed understanding.  Final Clinical Impressions(s) / UC Diagnoses   Final diagnoses:  Acute pain of left knee  Left leg pain     Discharge Instructions     It is very important that you go get your ultrasound tomorrow.  If you have any chest pain or shortness of breath overnight you need to go to the hospital.  Start Naprosyn twice daily for pain relief.  Do not take additional NSAIDs (aspirin, ibuprofen/Advil, naproxen/Aleve) with this medication.  Try to keep your leg elevated and use heat for symptom relief.  If anything worsens please return for reevaluation.  Follow-up with sports medicine if you do not have a blood clot and your symptoms continue.    ED Prescriptions    Medication Sig Dispense Auth. Provider   naproxen (NAPROSYN) 375 MG tablet Take 1 tablet (375 mg total) by mouth 2 (two) times daily for 5 days. 10 tablet Daryl Quiros, Derry Skill, PA-C     PDMP not reviewed this encounter.   Terrilee Croak, PA-C 05/24/20 1756

## 2020-05-24 NOTE — ED Triage Notes (Signed)
Pt reports back of Lt knee hurts down toward calf of leg.

## 2020-05-25 ENCOUNTER — Ambulatory Visit (HOSPITAL_COMMUNITY)
Admission: RE | Admit: 2020-05-25 | Discharge: 2020-05-25 | Disposition: A | Discharge status: Home / Self Care | Payer: 59 | Source: Ambulatory Visit | Attending: Physician Assistant | Admitting: Physician Assistant

## 2020-05-25 DIAGNOSIS — M7989 Other specified soft tissue disorders: Secondary | ICD-10-CM | POA: Diagnosis present

## 2020-05-25 DIAGNOSIS — M79605 Pain in left leg: Secondary | ICD-10-CM

## 2020-05-25 NOTE — Progress Notes (Signed)
Lower extremity venous has been completed.   Preliminary results in CV Proc.   Abram Sander 05/25/2020 11:10 AM

## 2020-05-26 ENCOUNTER — Other Ambulatory Visit: Payer: Self-pay | Admitting: Family Medicine

## 2020-05-26 ENCOUNTER — Other Ambulatory Visit: Payer: Self-pay

## 2020-05-26 ENCOUNTER — Ambulatory Visit (INDEPENDENT_AMBULATORY_CARE_PROVIDER_SITE_OTHER): Payer: 59 | Admitting: Sports Medicine

## 2020-05-26 VITALS — BP 100/60 | Ht 67.0 in | Wt 182.0 lb

## 2020-05-26 DIAGNOSIS — M25562 Pain in left knee: Secondary | ICD-10-CM | POA: Diagnosis not present

## 2020-05-26 DIAGNOSIS — I10 Essential (primary) hypertension: Secondary | ICD-10-CM

## 2020-05-26 NOTE — Patient Instructions (Signed)
It was great to meet you today! Thank you for letting me participate in your care!  Today, we discussed your left knee pain and it is most likely due to degenerative changes and loss of cartilage known as arthritis. I do want you to get the x-rays and I will call you with results. Please do the home exercises, keep using Naprosyn as directed, and ice the knee after activity. I would also try using Voltaren Gel and using a knee compression sleeve when active.  Be well, Harolyn Rutherford, DO PGY-4, Sports Medicine Fellow [place]

## 2020-05-26 NOTE — Progress Notes (Signed)
    SUBJECTIVE:   CHIEF COMPLAINT / HPI:   Left knee Gail Lloyd is a very pleasant 55 year old female for evaluation of left knee pain.  It began last week last Friday when she was playing with her grandchildren on the floor and started the next day.  Pain was very severe rated 10 out of 10 she has never had anything like this before.  She went to urgent care and due to swelling and concern for pain in the calf and got a venous Doppler ultrasound which was negative for any T.  She had no trauma, fall or inciting event that she can think of.  She was given Naprosyn in the urgent care and her pain is now a 7 out of 10.  She has had no giving, clicking, popping, or catching.  She is using a cane to walk and it still hurts with pain continuous.  Weightbearing makes it worse.  She did try ice and heat with very little relief.  PERTINENT  PMH / PSH: Hypertension, hyperlipidemia, prediabetes  OBJECTIVE:   BP 100/60   Ht 5\' 7"  (1.702 m)   Wt 182 lb (82.6 kg)   BMI 28.51 kg/m    No flowsheet data found.  Knee, Left: Inspection was negative for erythema, ecchymosis, and effusion. No obvious bony abnormalities or signs of osteophyte development. Palpation yielded no asymmetric warmth; Medial joint line tenderness; No patellar tenderness; Mild patellar crepitus. Patellar and quadriceps tendons unremarkable, and no tenderness of the pes anserine bursa. ROM normal in flexion (135 degrees) and extension (0 degrees). Normal hamstring and quadriceps strength. Neurovascularly intact bilaterally. 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  She is improving with conservative management so we will continue Naprosyn 375 twice a day.  I will also recommend wearing a compression knee sleeve using Voltaren gel and we will proceed with getting 4 view x-ray of the left knee to rule out fracture or more bony masses.  We will call her with x-ray results and she will follow up in 4 weeks so we can make sure she is making adequate progress.     Nuala Alpha, DO PGY-4, Sports Medicine Fellow [place]  Patient seen and evaluated with the sports medicine fellow.  I agree with the above plan of care.  Knee pain is likely due to osteoarthritis.  Treatment as above and follow-up in 3 to 4 weeks.  She will remain out of work the remainder of this week.  If symptoms persist we will start with getting x-rays and may need to consider further diagnostic imaging depending on what those show.

## 2020-05-26 NOTE — Assessment & Plan Note (Signed)
Given the patient's history, clinical presentation, and exam today she most likely has an acute flare of osteoarthritis of the left knee.  She is improving with conservative management so we will continue Naprosyn 375 twice a day.  I will also recommend wearing a compression knee sleeve using Voltaren gel and we will proceed with getting 4 view x-ray of the left knee to rule out fracture or more bony masses.  We will call her with x-ray results and she will follow up in 4 weeks so we can make sure she is making adequate progress.

## 2020-05-27 ENCOUNTER — Ambulatory Visit
Admission: RE | Admit: 2020-05-27 | Discharge: 2020-05-27 | Disposition: A | Discharge status: Home / Self Care | Payer: 59 | Source: Ambulatory Visit | Attending: Sports Medicine | Admitting: Sports Medicine

## 2020-05-27 DIAGNOSIS — M25562 Pain in left knee: Secondary | ICD-10-CM

## 2020-06-05 ENCOUNTER — Other Ambulatory Visit: Payer: 59 | Admitting: *Deleted

## 2020-06-05 ENCOUNTER — Other Ambulatory Visit: Payer: Self-pay

## 2020-06-05 DIAGNOSIS — E785 Hyperlipidemia, unspecified: Secondary | ICD-10-CM

## 2020-06-05 LAB — LIPID PANEL
Chol/HDL Ratio: 2.6 ratio (ref 0.0–4.4)
Cholesterol, Total: 124 mg/dL (ref 100–199)
HDL: 48 mg/dL (ref >39)
LDL Chol Calc (NIH): 56 mg/dL (ref 0–99)
Triglycerides: 113 mg/dL (ref 0–149)
VLDL Cholesterol Cal: 20 mg/dL (ref 5–40)

## 2020-06-05 LAB — HEPATIC FUNCTION PANEL
ALT: 18 IU/L (ref 0–32)
AST: 11 IU/L (ref 0–40)
Albumin: 4.1 g/dL (ref 3.8–4.9)
Alkaline Phosphatase: 70 IU/L (ref 44–121)
Bilirubin Total: 0.4 mg/dL (ref 0.0–1.2)
Bilirubin, Direct: 0.11 mg/dL (ref 0.00–0.40)
Total Protein: 7.7 g/dL (ref 6.0–8.5)

## 2020-06-16 ENCOUNTER — Ambulatory Visit (INDEPENDENT_AMBULATORY_CARE_PROVIDER_SITE_OTHER): Payer: 59 | Admitting: Sports Medicine

## 2020-06-16 ENCOUNTER — Other Ambulatory Visit: Payer: Self-pay

## 2020-06-16 VITALS — BP 118/82 | Ht 67.0 in | Wt 184.0 lb

## 2020-06-16 DIAGNOSIS — M25562 Pain in left knee: Secondary | ICD-10-CM | POA: Diagnosis not present

## 2020-06-16 NOTE — Progress Notes (Signed)
  Gail Lloyd - 55 y.o. female MRN [id number]  Date of birth: March 02, 1965  SUBJECTIVE:    CC: Left Knee follow up   Gail Lloyd is following up 4 weeks after having acute knee pain that was 10/10 after she had been kneeling with her grandchildren on the floor. She reports being 100% improved today without pain at all. She is back to work and doing normal activity without pain. She did quad strengthening exercises. She is not using any pain or antiinflammatory meds at the moment.   Objective:  VS: BP:118/82  HR: bpm  TEMP: ( )  RESP:   HT:5\' 7"  (170.2 cm)   WT:184 lb (83.5 kg)  BMI:28.81 PHYSICAL EXAM:  Well appearing woman in NAD.   Left Knee: No effusion or erythema.  Non tender to palpation at the joint lines or along the patella.  FROM in flexion and extesnion.  5/5 strength in knee flexion and extension.  Negative Mcmurrays.  Left Knee xray (5/25): Xrays reviewed and demonstrated minimal medial compartment narrowing. Additionally there was patella alta.  Findings are consistent with mild OA of the knee. Patella alta is most likely normal and not acute.   ASSESSMENT & PLAN:  Left Knee Pain: Her knee pain has improved. It was most likely 2/2 an OA flare up. She can continue to do quad strengthening exercises to prevent further flare ups. She can follow up with [place] as needed.   Marcelino Duster, MS4   Patient seen and evaluated with the medical student.  I agree with the above plan of care.  Knee pain has resolved.  X-rays did show some mild medial compartmental narrowing consistent with DJD.  She may continue to increase activity as tolerated and follow-up as needed.

## 2020-07-06 ENCOUNTER — Other Ambulatory Visit: Payer: Self-pay | Admitting: Family Medicine

## 2020-07-13 ENCOUNTER — Emergency Department (HOSPITAL_COMMUNITY)
Admission: EM | Admit: 2020-07-13 | Discharge: 2020-07-13 | Disposition: A | Discharge status: Home / Self Care | Payer: 59 | Attending: Emergency Medicine | Admitting: Emergency Medicine

## 2020-07-13 ENCOUNTER — Encounter (HOSPITAL_COMMUNITY): Payer: Self-pay | Admitting: Emergency Medicine

## 2020-07-13 DIAGNOSIS — R55 Syncope and collapse: Secondary | ICD-10-CM | POA: Insufficient documentation

## 2020-07-13 DIAGNOSIS — I10 Essential (primary) hypertension: Secondary | ICD-10-CM | POA: Diagnosis not present

## 2020-07-13 DIAGNOSIS — Z79899 Other long term (current) drug therapy: Secondary | ICD-10-CM | POA: Diagnosis not present

## 2020-07-13 DIAGNOSIS — R42 Dizziness and giddiness: Secondary | ICD-10-CM | POA: Diagnosis not present

## 2020-07-13 DIAGNOSIS — R531 Weakness: Secondary | ICD-10-CM | POA: Diagnosis not present

## 2020-07-13 DIAGNOSIS — J339 Nasal polyp, unspecified: Secondary | ICD-10-CM

## 2020-07-13 LAB — BASIC METABOLIC PANEL
Anion gap: 5 (ref 5–15)
BUN: 17 mg/dL (ref 6–20)
CO2: 26 mmol/L (ref 22–32)
Calcium: 9 mg/dL (ref 8.9–10.3)
Chloride: 109 mmol/L (ref 98–111)
Creatinine, Ser: 0.69 mg/dL (ref 0.44–1.00)
GFR, Estimated: >60 mL/min (ref >60)
Glucose, Bld: 132 mg/dL — ABNORMAL HIGH (ref 70–99)
Potassium: 3.8 mmol/L (ref 3.5–5.1)
Sodium: 140 mmol/L (ref 135–145)

## 2020-07-13 LAB — CBC WITH DIFFERENTIAL/PLATELET
Abs Immature Granulocytes: 0.01 10*3/uL (ref 0.00–0.07)
Basophils Absolute: 0 10*3/uL (ref 0.0–0.1)
Basophils Relative: 0 %
Eosinophils Absolute: 0.2 10*3/uL (ref 0.0–0.5)
Eosinophils Relative: 4 %
HCT: 37.4 % (ref 36.0–46.0)
Hemoglobin: 12.2 g/dL (ref 12.0–15.0)
Immature Granulocytes: 0 %
Lymphocytes Relative: 33 %
Lymphs Abs: 1.8 10*3/uL (ref 0.7–4.0)
MCH: 29.9 pg (ref 26.0–34.0)
MCHC: 32.6 g/dL (ref 30.0–36.0)
MCV: 91.7 fL (ref 80.0–100.0)
Monocytes Absolute: 0.6 10*3/uL (ref 0.1–1.0)
Monocytes Relative: 10 %
Neutro Abs: 2.8 10*3/uL (ref 1.7–7.7)
Neutrophils Relative %: 53 %
Platelets: 288 10*3/uL (ref 150–400)
RBC: 4.08 MIL/uL (ref 3.87–5.11)
RDW: 13.4 % (ref 11.5–15.5)
WBC: 5.3 10*3/uL (ref 4.0–10.5)
nRBC: 0 % (ref 0.0–0.2)

## 2020-07-13 NOTE — ED Provider Notes (Signed)
[facility] Provider Note  CSN: [id number] Arrival date & time: 07/13/20 0735    History Chief Complaint  Patient presents with   Weakness    Gail Lloyd is a 55 y.o. female with h/o HTN and HLD reports she began to feel weak and dizzy while driving to work this morning. She states she felt like she might pass out and that made her more nervous. She felt like her heart might have been racing a little bit but no chest pain, SOB, nausea, vomiting or diarrhea. She has had similar episodes several times since Nov last year. She has been to PCP and Cardiology with no definitive cause identified. She wore a 2 week ZIO Patch in the spring which was negative. She is feeling better since arrival to the ED.    Past Medical History:  Diagnosis Date   Heart murmur     Past Surgical History:  Procedure Laterality Date   WISDOM TOOTH EXTRACTION      Family History  Problem Relation Age of Onset   Diabetes Mother    Hypertension Mother    Hypertension Father    Cancer Father     Social History   Tobacco Use   Smoking status: Never   Smokeless tobacco: Never  Vaping Use   Vaping Use: Never used  Substance Use Topics   Alcohol use: No   Drug use: No     Home Medications Prior to Admission medications   Medication Sig Start Date End Date Taking? Authorizing Provider  amLODipine (NORVASC) 5 MG tablet TAKE 1 TABLET(5 MG) BY MOUTH DAILY 04/22/20   Billie Ruddy, MD  Ascorbic Acid (VITAMIN C) 1000 MG tablet Take 1,000 mg by mouth daily.    [provider]  carvedilol (COREG) 6.25 MG tablet TAKE 1 TABLET BY MOUTH 2 TIMES DAILY WITH A MEAL. 05/26/20   Billie Ruddy, MD  diphenhydrAMINE (BENADRYL) 25 MG tablet Take 25 mg by mouth every 6 (six) hours as needed for allergies.    [provider]  fluticasone (FLONASE) 50 MCG/ACT nasal spray SHAKE LIQUID AND USE 1 SPRAY IN EACH NOSTRIL DAILY 01/01/20   Billie Ruddy, MD  ipratropium  (ATROVENT) 0.06 % nasal spray Place 2 sprays into both nostrils 4 (four) times daily. 02/23/17   Tasia Catchings, Amy V, PA-C  losartan (COZAAR) 50 MG tablet TAKE 1 TABLET(50 MG) BY MOUTH DAILY 07/07/20   Billie Ruddy, MD  melatonin 5 MG TABS Take 5 mg by mouth daily as needed (sleep).    [provider]  Multiple Vitamin (MULTIVITAMIN WITH MINERALS) TABS tablet Take 1 tablet by mouth daily.    [provider]  rosuvastatin (CRESTOR) 10 MG tablet Take 1 tablet (10 mg total) by mouth daily. 03/05/20   Jettie Booze, MD     Allergies    Penicillins and Sudafed [pseudoephedrine hcl]   Review of Systems   Review of Systems A comprehensive review of systems was completed and negative except as noted in HPI.    Physical Exam BP 128/78   Pulse 74   Temp 97.7 F (36.5 C) (Oral)   Resp 16   SpO2 98%   Physical Exam Vitals and nursing note reviewed.  Constitutional:      Appearance: Normal appearance.  HENT:     Head: Normocephalic and atraumatic.     Nose: Nose normal.     Mouth/Throat:     Mouth: Mucous membranes are moist.  Eyes:  Extraocular Movements: Extraocular movements intact.     Conjunctiva/sclera: Conjunctivae normal.  Cardiovascular:     Rate and Rhythm: Normal rate.  Pulmonary:     Effort: Pulmonary effort is normal.     Breath sounds: Normal breath sounds.  Abdominal:     General: Abdomen is flat.     Palpations: Abdomen is soft.     Tenderness: There is no abdominal tenderness.  Musculoskeletal:        General: No swelling. Normal range of motion.     Cervical back: Neck supple.  Skin:    General: Skin is warm and dry.  Neurological:     General: No focal deficit present.     Mental Status: She is alert.  Psychiatric:        Mood and Affect: Mood normal.     ED Results / Procedures / Treatments   Labs (all labs ordered are listed, but only abnormal results are displayed) Labs Reviewed  BASIC METABOLIC PANEL - Abnormal; Notable for the  following components:      Result Value   Glucose, Bld 132 (*)    All other components within normal limits  CBC WITH DIFFERENTIAL/PLATELET    EKG EKG Interpretation  Date/Time:  Monday July 13 2020 08:32:48 EDT Ventricular Rate:  75 PR Interval:  186 QRS Duration: 90 QT Interval:  412 QTC Calculation: 460 R Axis:   21 Text Interpretation: Normal sinus rhythm Cannot rule out Anterior infarct , age undetermined Abnormal ECG No significant change since last tracing Confirmed by Calvert Cantor [phone]) on 07/13/2020 8:36:33 AM  Radiology No results found.  Procedures Procedures  Medications Ordered in the ED Medications - No data to display   MDM Rules/Calculators/A&P MDM Patient with feeling of dizziness while driving. Has not had labs checked in several months, will check basic labs, EKG. She is asymptomatic now. Similar episodes in recent months, may be anxiety related. No recent change in life stressors per patient.  ED Course  I have reviewed the triage vital signs and the nursing notes.  Pertinent labs & imaging results that were available during my care of the patient were reviewed by me and considered in my medical decision making (see chart for details).  Clinical Course as of 07/13/20 0944  Mon Jul 13, 2020  0916 CBC is normal.  [CS]  0941 BMP is unremarkable. Patient remains asymptomatic. Recommend PCP follow up, she thinks her symptoms may be due to anxiety.  Prior to discharge, she mentioned she has been having some sinus congestion recently. There is maybe a small polyp in her R naris, but no signs of acute infection. TMs are neg. Recommend ENT follow up.  [CS]    Clinical Course User Index [CS] Truddie Hidden, MD    Final Clinical Impression(s) / ED Diagnoses Final diagnoses:  Near syncope  Nasal polyp    Rx / DC Orders ED Discharge Orders     None        Truddie Hidden, MD 07/13/20 [id number]

## 2020-07-13 NOTE — ED Triage Notes (Signed)
Patient c/o onset of generalized weakness and "nervousness" while driving to work this morning. Denies chest pain and SOB.

## 2020-07-29 DIAGNOSIS — R0981 Nasal congestion: Secondary | ICD-10-CM | POA: Insufficient documentation

## 2020-08-10 ENCOUNTER — Ambulatory Visit: Payer: 59 | Admitting: Family Medicine

## 2020-08-14 ENCOUNTER — Encounter: Payer: Self-pay | Admitting: Family Medicine

## 2020-08-14 ENCOUNTER — Other Ambulatory Visit: Payer: Self-pay

## 2020-08-14 ENCOUNTER — Ambulatory Visit: Payer: 59 | Admitting: Family Medicine

## 2020-08-14 VITALS — BP 126/82 | HR 83 | Temp 98.3°F | Wt 197.2 lb

## 2020-08-14 DIAGNOSIS — R55 Syncope and collapse: Secondary | ICD-10-CM

## 2020-08-14 DIAGNOSIS — R7303 Prediabetes: Secondary | ICD-10-CM | POA: Diagnosis not present

## 2020-08-14 DIAGNOSIS — I10 Essential (primary) hypertension: Secondary | ICD-10-CM | POA: Diagnosis not present

## 2020-08-14 MED ORDER — AMLODIPINE BESYLATE 5 MG PO TABS: ORAL_TABLET | ORAL | 3 refills | Status: DC

## 2020-08-14 NOTE — Progress Notes (Signed)
Subjective:    Patient ID: Gail Lloyd, female    DOB: 04-27-1965, 55 y.o.   MRN: [id number]  Chief Complaint  Patient presents with   Follow-up    BP, palpitations  Went to ER and has questions on near-syncope    HPI Patient was seen today for follow-up.  Patient seen in ED for near syncopal episode 07/13/2020.  Patient felt dizzy while driving with palpitations.  Labs in ED normal.  Patient notes some increased anxiety which may be contributing to symptoms.  In the past seen by cardiology.  Wore Zio patch x2.  Past Medical History:  Diagnosis Date   Heart murmur     Allergies  Allergen Reactions   Penicillins Hives and Itching   Sudafed [Pseudoephedrine Hcl] Palpitations    ROS General: Denies fever, chills, night sweats, changes in weight, changes in appetite  + near syncope, dizziness HEENT: Denies headaches, ear pain, changes in vision, rhinorrhea, sore throat CV: Denies CP, palpitations, SOB, orthopnea Pulm: Denies SOB, cough, wheezing GI: Denies abdominal pain, nausea, vomiting, diarrhea, constipation GU: Denies dysuria, hematuria, frequency, vaginal discharge Msk: Denies muscle cramps, joint pains Neuro: Denies weakness, numbness, tingling Skin: Denies rashes, bruising Psych: Denies depression, anxiety, hallucinations +anxiety     Objective:    Blood pressure 126/82, pulse 83, temperature 98.3 F (36.8 C), temperature source Oral, weight 197 lb 3.2 oz (89.4 kg), SpO2 98 %.  Gen. Pleasant, well-nourished, in no distress, normal affect   HEENT: /AT, face symmetric, conjunctiva clear, no scleral icterus, PERRLA, EOMI, nares patent without drainage, pharynx without erythema or exudate. Neck: No JVD, no thyromegaly, no carotid bruits Lungs: no accessory muscle use, CTAB, no wheezes or rales Cardiovascular: RRR, no m/r/g, no peripheral edema Musculoskeletal: No deformities, no cyanosis or clubbing, normal tone Neuro:  A&Ox3, CN II-XII intact, normal gait Skin:   Warm, no lesions/ rash   Wt Readings from Last 3 Encounters:  08/14/20 197 lb 3.2 oz (89.4 kg)  06/16/20 184 lb (83.5 kg)  05/26/20 182 lb (82.6 kg)    Lab Results  Component Value Date   WBC 5.3 07/13/2020   HGB 12.2 07/13/2020   HCT 37.4 07/13/2020   PLT 288 07/13/2020   GLUCOSE 132 (H) 07/13/2020   CHOL 124 06/05/2020   TRIG 113 06/05/2020   HDL 48 06/05/2020   LDLCALC 56 06/05/2020   ALT 18 06/05/2020   AST 11 06/05/2020   NA 140 07/13/2020   K 3.8 07/13/2020   CL 109 07/13/2020   CREATININE 0.69 07/13/2020   BUN 17 07/13/2020   CO2 26 07/13/2020   TSH 1.22 12/02/2019   HGBA1C 6.3 (H) 12/02/2019    Assessment/Plan:  Near syncope  -possibly 2/2 hyperglycemia. -Discussed obtaining labs such as thyroid which was not done in ED. -Discussed the importance of eating regular meals and staying hydrated -Consider counseling. - Plan: TSH, T4, Free, CBC with Differential/Platelet, Hemoglobin A1c, CMP  Prediabetes -hgb A1C 6.3% on 12/02/19 -Lifestyle modifications -Plan: hemoglobin A1c  HTN -controlled -continue current meds -Plan: refill Norvasc 5 mg  F/u in 1 month  Grier Mitts, MD

## 2020-08-15 LAB — CBC WITH DIFFERENTIAL/PLATELET
Absolute Monocytes: 695 cells/uL (ref 200–950)
Basophils Absolute: 18 cells/uL (ref 0–200)
Basophils Relative: 0.3 %
Eosinophils Absolute: 244 cells/uL (ref 15–500)
Eosinophils Relative: 4 %
HCT: 35.7 % (ref 35.0–45.0)
Hemoglobin: 11.7 g/dL (ref 11.7–15.5)
Lymphs Abs: 2092 cells/uL (ref 850–3900)
MCH: 29.1 pg (ref 27.0–33.0)
MCHC: 32.8 g/dL (ref 32.0–36.0)
MCV: 88.8 fL (ref 80.0–100.0)
MPV: 9.3 fL (ref 7.5–12.5)
Monocytes Relative: 11.4 %
Neutro Abs: 3050 cells/uL (ref 1500–7800)
Neutrophils Relative %: 50 %
Platelets: 340 10*3/uL (ref 140–400)
RBC: 4.02 10*6/uL (ref 3.80–5.10)
RDW: 12.8 % (ref 11.0–15.0)
Total Lymphocyte: 34.3 %
WBC: 6.1 10*3/uL (ref 3.8–10.8)

## 2020-08-15 LAB — COMPREHENSIVE METABOLIC PANEL
AG Ratio: 1.2 (calc) (ref 1.0–2.5)
ALT: 12 U/L (ref 6–29)
AST: 12 U/L (ref 10–35)
Albumin: 3.8 g/dL (ref 3.6–5.1)
Alkaline phosphatase (APISO): 56 U/L (ref 37–153)
BUN: 15 mg/dL (ref 7–25)
CO2: 28 mmol/L (ref 20–32)
Calcium: 9.2 mg/dL (ref 8.6–10.4)
Chloride: 104 mmol/L (ref 98–110)
Creat: 0.73 mg/dL (ref 0.50–1.03)
Globulin: 3.2 g/dL (calc) (ref 1.9–3.7)
Glucose, Bld: 95 mg/dL (ref 65–99)
Potassium: 3.9 mmol/L (ref 3.5–5.3)
Sodium: 140 mmol/L (ref 135–146)
Total Bilirubin: 0.6 mg/dL (ref 0.2–1.2)
Total Protein: 7 g/dL (ref 6.1–8.1)

## 2020-08-15 LAB — T4, FREE: Free T4: 1 ng/dL (ref 0.8–1.8)

## 2020-08-15 LAB — TSH: TSH: 1.37 mIU/L

## 2020-08-15 LAB — HEMOGLOBIN A1C
Hgb A1c MFr Bld: 6.6 % of total Hgb — ABNORMAL HIGH (ref <5.7)
Mean Plasma Glucose: 143 mg/dL
eAG (mmol/L): 7.9 mmol/L

## 2020-09-14 ENCOUNTER — Encounter: Payer: Self-pay | Admitting: Family Medicine

## 2020-09-14 ENCOUNTER — Ambulatory Visit: Payer: 59 | Admitting: Family Medicine

## 2020-09-14 ENCOUNTER — Other Ambulatory Visit: Payer: Self-pay

## 2020-09-14 VITALS — BP 118/76 | HR 70 | Temp 98.3°F | Wt 195.8 lb

## 2020-09-14 DIAGNOSIS — I10 Essential (primary) hypertension: Secondary | ICD-10-CM | POA: Diagnosis not present

## 2020-09-14 DIAGNOSIS — E119 Type 2 diabetes mellitus without complications: Secondary | ICD-10-CM | POA: Insufficient documentation

## 2020-09-14 NOTE — Progress Notes (Signed)
Subjective:    Patient ID: Gail Lloyd, female    DOB: 1965/06/23, 55 y.o.   MRN: [id number]  Chief Complaint  Patient presents with   Follow-up    BP pre DM     HPI Patient was seen today for f/u on chronic conditions.  Pt last seen 08/14/20 after being seen in the ED for a near syncopal episode.  Feeling better since making diet changes.  No recent presyncopal episodes.  Pt cut out sweets including cookies, ice cream, candy.  Patient notes family history of DM in mom, Sister, aunts and uncles.  Pt trying to eat breakfast daily but often gets busy at work and forgets.  Purchased a glucometer.  BP has improved and patient lost 2 pounds.  Past Medical History:  Diagnosis Date   Heart murmur     Allergies  Allergen Reactions   Penicillins Hives and Itching   Sudafed [Pseudoephedrine Hcl] Palpitations    ROS General: Denies fever, chills, night sweats, changes in weight, changes in appetite HEENT: Denies headaches, ear pain, changes in vision, rhinorrhea, sore throat CV: Denies CP, palpitations, SOB, orthopnea Pulm: Denies SOB, cough, wheezing GI: Denies abdominal pain, nausea, vomiting, diarrhea, constipation GU: Denies dysuria, hematuria, frequency, vaginal discharge Msk: Denies muscle cramps, joint pains Neuro: Denies weakness, numbness, tingling Skin: Denies rashes, bruising Psych: Denies depression, anxiety, hallucinations     Objective:    Blood pressure 118/76, pulse 70, temperature 98.3 F (36.8 C), temperature source Oral, weight 195 lb 12.8 oz (88.8 kg), SpO2 97 %.  Gen. Pleasant, well-nourished, in no distress, normal affect   HEENT: [place]/AT, face symmetric, conjunctiva clear, no scleral icterus, PERRLA, EOMI, nares patent without drainage Lungs: no accessory muscle use Cardiovascular: RRR, no peripheral edema Musculoskeletal: No deformities, no cyanosis or clubbing, normal tone Neuro:  A&Ox3, CN II-XII intact, normal gait Skin:  Warm, no lesions/ rash   Wt  Readings from Last 3 Encounters:  09/14/20 195 lb 12.8 oz (88.8 kg)  08/14/20 197 lb 3.2 oz (89.4 kg)  06/16/20 184 lb (83.5 kg)    Lab Results  Component Value Date   WBC 6.1 08/14/2020   HGB 11.7 08/14/2020   HCT 35.7 08/14/2020   PLT 340 08/14/2020   GLUCOSE 95 08/14/2020   CHOL 124 06/05/2020   TRIG 113 06/05/2020   HDL 48 06/05/2020   LDLCALC 56 06/05/2020   ALT 12 08/14/2020   AST 12 08/14/2020   NA 140 08/14/2020   K 3.9 08/14/2020   CL 104 08/14/2020   CREATININE 0.73 08/14/2020   BUN 15 08/14/2020   CO2 28 08/14/2020   TSH 1.37 08/14/2020   HGBA1C 6.6 (H) 08/14/2020    Assessment/Plan:  Diet-controlled diabetes -Hemoglobin A1c 6.6% at last OFV on 08/14/2020, -Continue lifestyle modifications -Congratulated on weight loss -Reviewed glucometer use -On ARB and statin -Will obtain foot exam at next OFV -We will have pt schedule yearly eye exam -In 2-3 months  Essential hypertension -Controlled -Continue lifestyle applications -Continue Norvasc 5 mg daily, losartan 50 mg daily, Coreg 6.25 mg twice daily  F/u in 2-3 months, sooner if needed  Grier Mitts, MD

## 2020-11-05 ENCOUNTER — Other Ambulatory Visit: Payer: Self-pay

## 2020-11-06 ENCOUNTER — Ambulatory Visit: Payer: 59 | Admitting: Family Medicine

## 2020-11-06 ENCOUNTER — Encounter: Payer: Self-pay | Admitting: Family Medicine

## 2020-11-06 VITALS — BP 122/78 | HR 81 | Temp 98.7°F | Wt 192.6 lb

## 2020-11-06 DIAGNOSIS — Z1211 Encounter for screening for malignant neoplasm of colon: Secondary | ICD-10-CM | POA: Diagnosis not present

## 2020-11-06 DIAGNOSIS — E119 Type 2 diabetes mellitus without complications: Secondary | ICD-10-CM | POA: Diagnosis not present

## 2020-11-06 DIAGNOSIS — I10 Essential (primary) hypertension: Secondary | ICD-10-CM | POA: Diagnosis not present

## 2020-11-06 MED ORDER — CARVEDILOL 6.25 MG PO TABS: 6.2500 mg | ORAL_TABLET | Freq: Two times a day (BID) | ORAL | 3 refills | Status: DC

## 2020-11-06 MED ORDER — LOSARTAN POTASSIUM 50 MG PO TABS
50.0000 mg | ORAL_TABLET | Freq: Every day | ORAL | 3 refills | Status: DC
Start: 2020-11-06 — End: 2021-10-25

## 2020-11-06 NOTE — Patient Instructions (Signed)
Remember to ask her eye doctor if they did a diabetic retinopathy screen.

## 2020-11-06 NOTE — Progress Notes (Signed)
Subjective:    Patient ID: Gail Lloyd, female    DOB: Nov 28, 1965, 55 y.o.   MRN: [id number]  Chief Complaint  Patient presents with   Follow-up    BP, DM     HPI Patient was seen today for follow-up on DM and HTN.  Patient with diet-controlled diabetes.  Patient states she has decreased her intake of sweets and carbohydrates.  Patient states she feels good overall.  Had an eye exam a few months ago at the [facility] on [address].  States BP has decreased as well as her weight.  Pt has never had a colonoscopy.  States had an uncle with colon cancer in his late 18s and her stepfather.  Pt to schedule mammogram.  Past Medical History:  Diagnosis Date   Heart murmur     Allergies  Allergen Reactions   Penicillins Hives and Itching   Sudafed [Pseudoephedrine Hcl] Palpitations    ROS General: Denies fever, chills, night sweats, changes in weight, changes in appetite + weight loss HEENT: Denies headaches, ear pain, changes in vision, rhinorrhea, sore throat CV: Denies CP, palpitations, SOB, orthopnea Pulm: Denies SOB, cough, wheezing GI: Denies abdominal pain, nausea, vomiting, diarrhea, constipation GU: Denies dysuria, hematuria, frequency, vaginal discharge Msk: Denies muscle cramps, joint pains Neuro: Denies weakness, numbness, tingling Skin: Denies rashes, bruising Psych: Denies depression, anxiety, hallucinations    Objective:    Blood pressure 122/78, pulse 81, temperature 98.7 F (37.1 C), temperature source Oral, weight 192 lb 9.6 oz (87.4 kg), SpO2 98 %.  Gen. Pleasant, well-nourished, in no distress, normal affect   HEENT: [place]/AT, face symmetric, conjunctiva clear, no scleral icterus, PERRLA, EOMI, nares patent without drainage Lungs: no accessory muscle use Cardiovascular: RRR, no peripheral edema Musculoskeletal: No deformities, no cyanosis or clubbing, normal tone Neuro:  A&Ox3, CN II-XII intact, normal gait Skin:  Warm, no lesions/ rash  Diabetic Foot Exam -  Simple   Simple Foot Form Diabetic Foot exam was performed with the following findings: Yes 11/06/2020 10:02 AM  Visual Inspection No deformities, no ulcerations, no other skin breakdown bilaterally: Yes Sensation Testing Intact to touch and monofilament testing bilaterally: Yes Pulse Check Posterior Tibialis and Dorsalis pulse intact bilaterally: Yes Comments      Wt Readings from Last 3 Encounters:  11/06/20 192 lb 9.6 oz (87.4 kg)  09/14/20 195 lb 12.8 oz (88.8 kg)  08/14/20 197 lb 3.2 oz (89.4 kg)    Lab Results  Component Value Date   WBC 6.1 08/14/2020   HGB 11.7 08/14/2020   HCT 35.7 08/14/2020   PLT 340 08/14/2020   GLUCOSE 95 08/14/2020   CHOL 124 06/05/2020   TRIG 113 06/05/2020   HDL 48 06/05/2020   LDLCALC 56 06/05/2020   ALT 12 08/14/2020   AST 12 08/14/2020   NA 140 08/14/2020   K 3.9 08/14/2020   CL 104 08/14/2020   CREATININE 0.73 08/14/2020   BUN 15 08/14/2020   CO2 28 08/14/2020   TSH 1.37 08/14/2020   HGBA1C 6.6 (H) 08/14/2020    Assessment/Plan:  Essential hypertension -controlled -continue Coreg 6.25 mg twice daily, losartan 50 mg daily, Norvasc 5 mg daily. -Continue lifestyle modifications -Continue checking BP at home and keep a log to bring with you to clinic  - Plan: carvedilol (COREG) 6.25 MG tablet, losartan (COZAAR) 50 MG tablet  Diet-controlled diabetes mellitus ([place]) -Hemoglobin A1c 6.6% on 08/14/2020 -Congratulated on weight loss -Continue lifestyle modifications -On ACE-I -Statin not indicated -Foot exam done this  visit -Patient to check with eye care provider in regards to diabetic retinopathy screening -Influenza vaccine up-to-date done 11/02/2020  Colon cancer screening -Family history of uncle with colon cancer in his late 37s. -Plan: Referral to gastroenterology  F/u in 4 months, sooner if needed.  Grier Mitts, MD

## 2020-11-16 ENCOUNTER — Ambulatory Visit: Payer: 59 | Admitting: Family Medicine

## 2020-11-17 ENCOUNTER — Encounter: Payer: Self-pay | Admitting: Internal Medicine

## 2020-12-31 ENCOUNTER — Ambulatory Visit (AMBULATORY_SURGERY_CENTER): Payer: 59 | Admitting: *Deleted

## 2020-12-31 ENCOUNTER — Other Ambulatory Visit: Payer: Self-pay

## 2020-12-31 VITALS — Ht 67.0 in | Wt 192.0 lb

## 2020-12-31 DIAGNOSIS — Z1211 Encounter for screening for malignant neoplasm of colon: Secondary | ICD-10-CM

## 2020-12-31 MED ORDER — NA SULFATE-K SULFATE-MG SULF 17.5-3.13-1.6 GM/177ML PO SOLN: 1.0000 | Freq: Once | ORAL | 0 refills | Status: AC

## 2020-12-31 NOTE — Progress Notes (Signed)
No egg or soy allergy known to patient  No issues known to pt with past sedation with any surgeries or procedures Patient denies ever being told they had issues or difficulty with intubation  No FH of Malignant Hyperthermia Pt is not on diet pills Pt is not on  home 02  Pt is not on blood thinners  Pt denies issues with constipation  No A fib or A flutter  Pt is fully vaccinated  for Covid   NO PA's for preps discussed with pt In PV today  Discussed with pt there will be an out-of-pocket cost for prep and that varies from $0 to 70 +  dollars - pt verbalized understanding   Due to the COVID-19 pandemic we are asking patients to follow certain guidelines in PV and the [facility]   Pt aware of COVID protocols and [facility] guidelines   PV completed over the phone. Pt verified name, DOB, address and insurance during PV today.  Pt mailed instruction packet with copy of consent form to read and not return, and instructions.   Pt encouraged to call with questions or issues.  If pt has My chart, procedure instructions sent via My Chart

## 2021-01-08 ENCOUNTER — Encounter: Payer: Self-pay | Admitting: Internal Medicine

## 2021-01-14 ENCOUNTER — Telehealth: Payer: Self-pay | Admitting: Internal Medicine

## 2021-01-14 ENCOUNTER — Ambulatory Visit (AMBULATORY_SURGERY_CENTER): Payer: 59 | Admitting: Internal Medicine

## 2021-01-14 ENCOUNTER — Other Ambulatory Visit: Payer: Self-pay

## 2021-01-14 ENCOUNTER — Encounter: Payer: Self-pay | Admitting: Internal Medicine

## 2021-01-14 VITALS — BP 121/58 | HR 74 | Temp 98.0°F | Resp 23 | Ht 67.0 in | Wt 192.0 lb

## 2021-01-14 DIAGNOSIS — D120 Benign neoplasm of cecum: Secondary | ICD-10-CM

## 2021-01-14 DIAGNOSIS — Z1211 Encounter for screening for malignant neoplasm of colon: Secondary | ICD-10-CM | POA: Diagnosis not present

## 2021-01-14 DIAGNOSIS — K6389 Other specified diseases of intestine: Secondary | ICD-10-CM | POA: Diagnosis not present

## 2021-01-14 MED ORDER — SODIUM CHLORIDE 0.9 % IV SOLN: 500.0000 mL | Freq: Once | INTRAVENOUS | Status: DC

## 2021-01-14 NOTE — Telephone Encounter (Signed)
Inbound Call from [place] at [facility]. States patient is scheduled to see Dr. Marcello Moores 1/16 but Dr. Marcello Moores prefer patient to have CT of chest, abdomen, and pelvis before being seen. Is asking if Dr. Lorenso Courier can order a STAT CT for patient.

## 2021-01-14 NOTE — Op Note (Signed)
[facility] Patient Name: Gail Lloyd Procedure Date: 01/14/2021 7:47 AM MRN: [id number] Endoscopist: Sonny Masters "[name] ,  Age: 56 Referring MD:  Date of Birth: 18-Nov-1965 Gender: Female Account #: [id number] Procedure:                Colonoscopy Indications:              Screening for colorectal malignant neoplasm, This                            is the patient's first colonoscopy Medicines:                Monitored Anesthesia Care Procedure:                After obtaining informed consent, the colonoscope                            was passed under direct vision. Throughout the                            procedure, the patient's blood pressure, pulse, and                            oxygen saturations were monitored continuously. The                            Olympus CF-HQ190L [id number]) Colonoscope was                            introduced through the anus and advanced to the the                            terminal ileum. The colonoscopy was performed                            without difficulty. The patient tolerated the                            procedure well. The quality of the bowel                            preparation was excellent. The terminal ileum,                            ileocecal valve, appendiceal orifice, and rectum                            were photographed. Scope In: 8:47:45 AM Scope Out: 9:06:55 AM Scope Withdrawal Time: 0 hours 15 minutes 18 seconds  Total Procedure Duration: 0 hours 19 minutes 10 seconds  Findings:                 The terminal ileum appeared normal.                           A 35 mm, non-bleeding polyp/mass was found in the  cecum. The polyp was carpet-like, multi-lobulated,                            sessile and lateral spreading. The polyp covered                            the appendiceal orifice. Biopsies were taken with a                            cold forceps for histology.                            A few small-mouthed diverticula were found in the                            sigmoid colon.                           Non-bleeding internal hemorrhoids were found during                            retroflexion. Complications:            No immediate complications. Estimated Blood Loss:     Estimated blood loss was minimal. Impression:               - The examined portion of the ileum was normal.                           - One 35 mm, non-bleeding polyp/mass in the cecum.                            Biopsied.                           - Diverticulosis in the sigmoid colon.                           - Non-bleeding internal hemorrhoids. Recommendation:           - Discharge patient to home (with escort).                           - Await pathology results.                           - Refer to a Dealer.                           - The findings and recommendations were discussed                            with the patient. Sonny Masters "[name],  01/14/2021 9:19:28 AM

## 2021-01-14 NOTE — Patient Instructions (Addendum)
YOU HAD AN ENDOSCOPIC PROCEDURE TODAY AT THE [facility]:   Refer to the procedure report that was given to you for any specific questions about what was found during the examination.  If the procedure report does not answer your questions, please call your gastroenterologist to clarify.  If you requested that your care partner not be given the details of your procedure findings, then the procedure report has been included in a sealed envelope for you to review at your convenience later.  YOU SHOULD EXPECT: Some feelings of bloating in the abdomen. Passage of more gas than usual.  Walking can help get rid of the air that was put into your GI tract during the procedure and reduce the bloating. If you had a lower endoscopy (such as a colonoscopy or flexible sigmoidoscopy) you may notice spotting of blood in your stool or on the toilet paper. If you underwent a bowel prep for your procedure, you may not have a normal bowel movement for a few days.  Please Note:  You might notice some irritation and congestion in your nose or some drainage.  This is from the oxygen used during your procedure.  There is no need for concern and it should clear up in a day or so.  SYMPTOMS TO REPORT IMMEDIATELY:   Following lower endoscopy (colonoscopy or flexible sigmoidoscopy):  Excessive amounts of blood in the stool  Significant tenderness or worsening of abdominal pains  Swelling of the abdomen that is new, acute  Fever of 100F or higher  For urgent or emergent issues, a gastroenterologist can be reached at any hour by calling [phone]. Do not use MyChart messaging for urgent concerns.    DIET:  We do recommend a small meal at first, but then you may proceed to your regular diet.  Drink plenty of fluids but you should avoid alcoholic beverages for 24 hours.  ACTIVITY:  You should plan to take it easy for the rest of today and you should NOT DRIVE or use heavy machinery until tomorrow (because  of the sedation medicines used during the test).    FOLLOW UP: Our staff will call the number listed on your records 48-72 hours following your procedure to check on you and address any questions or concerns that you may have regarding the information given to you following your procedure. If we do not reach you, we will leave a message.  We will attempt to reach you two times.  During this call, we will ask if you have developed any symptoms of COVID 19. If you develop any symptoms (ie: fever, flu-like symptoms, shortness of breath, cough etc.) before then, please call [phone].  If you test positive for Covid 19 in the 2 weeks post procedure, please call and report this information to us.    If any biopsies were taken you will be contacted by phone or by letter within the next 1-3 weeks.  Please call us at [phone] if you have not heard about the biopsies in 3 weeks.    SIGNATURES/CONFIDENTIALITY: You and/or your care partner have signed paperwork which will be entered into your electronic medical record.  These signatures attest to the fact that that the information above on your After Visit Summary has been reviewed and is understood.  Full responsibility of the confidentiality of this discharge information lies with you and/or your care-partner. 

## 2021-01-14 NOTE — Progress Notes (Signed)
To pacu, VSS. Report to [email or web address].tb 

## 2021-01-14 NOTE — Telephone Encounter (Signed)
Pt has been scheduled with Dr. Marcello Moores @ [facility] on 01/18/21 @ 210pm, arrival time 140pm. Staff has also reserved a time on 01/19/21 to allow ample time for pt to have CT scans completed and resulted.   Dr. Lorenso Courier sent response advising she would order CT A/P and CT chest. Pt has been scheduled at [facility] on 01/15/21 @ 1030am, arrival time 1000am, NPO 4 hrs prior to exam, drink 1st bottle of contrast at 830am and 2nd bottle of contrast at 930am. Pt provided with date/time/location of exam. Advised she will need to pick up contrast by 5pm today. In addition, advised she will receive a call from [facility] re: any changes that have taken place for her appt with Dr. Marcello Moores. Verbalized acceptance and understanding.

## 2021-01-14 NOTE — Progress Notes (Signed)
Called to room to assist during endoscopic procedure.  Patient ID and intended procedure confirmed with present staff. Received instructions for my participation in the procedure from the performing physician.  

## 2021-01-14 NOTE — Progress Notes (Signed)
GASTROENTEROLOGY PROCEDURE H&P NOTE   Primary Care Physician: Billie Ruddy, MD    Reason for Procedure:   Colon cancer screening  Plan:    Colonoscopy  Patient is appropriate for endoscopic procedure(s) in the ambulatory ([place]) setting.  The nature of the procedure, as well as the risks, benefits, and alternatives were carefully and thoroughly reviewed with the patient. Ample time for discussion and questions allowed. The patient understood, was satisfied, and agreed to proceed.     HPI: Gail Lloyd is a 56 y.o. female who presents for colonoscopy for colon cancer screening. Denies blood in stools, changes in bowel habits, weight loss. Denies fam hx of colon cancer. This is her first colonoscopy  Past Medical History:  Diagnosis Date   Heart murmur    Hyperlipidemia    on meds  and controlled   Hypertension    controlled on meds   Prediabetes    no meds    Past Surgical History:  Procedure Laterality Date   WISDOM TOOTH EXTRACTION      Prior to Admission medications   Medication Sig Start Date End Date Taking? Authorizing Provider  amLODipine (NORVASC) 5 MG tablet TAKE 1 TABLET(5 MG) BY MOUTH DAILY 08/14/20  Yes Billie Ruddy, MD  Ascorbic Acid (VITAMIN C) 1000 MG tablet Take 1,000 mg by mouth daily.   Yes [provider]  carvedilol (COREG) 6.25 MG tablet Take 1 tablet (6.25 mg total) by mouth 2 (two) times daily with a meal. 11/06/20  Yes Billie Ruddy, MD  fluticasone (FLONASE) 50 MCG/ACT nasal spray SHAKE LIQUID AND USE 1 SPRAY IN EACH NOSTRIL DAILY 01/01/20  Yes Billie Ruddy, MD  ipratropium (ATROVENT) 0.06 % nasal spray Place 2 sprays into both nostrils 4 (four) times daily. 02/23/17  Yes Yu, Amy V, PA-C  losartan (COZAAR) 50 MG tablet Take 1 tablet (50 mg total) by mouth daily. 11/06/20  Yes Billie Ruddy, MD  Multiple Vitamin (MULTIVITAMIN WITH MINERALS) TABS tablet Take 1 tablet by mouth daily.   Yes [provider]   rosuvastatin (CRESTOR) 10 MG tablet Take 1 tablet (10 mg total) by mouth daily. 03/05/20  Yes Jettie Booze, MD  diphenhydrAMINE (BENADRYL) 25 MG tablet Take 25 mg by mouth every 6 (six) hours as needed for allergies.    [provider]    Current Outpatient Medications  Medication Sig Dispense Refill   amLODipine (NORVASC) 5 MG tablet TAKE 1 TABLET(5 MG) BY MOUTH DAILY 90 tablet 3   Ascorbic Acid (VITAMIN C) 1000 MG tablet Take 1,000 mg by mouth daily.     carvedilol (COREG) 6.25 MG tablet Take 1 tablet (6.25 mg total) by mouth 2 (two) times daily with a meal. 180 tablet 3   fluticasone (FLONASE) 50 MCG/ACT nasal spray SHAKE LIQUID AND USE 1 SPRAY IN EACH NOSTRIL DAILY 16 g 0   ipratropium (ATROVENT) 0.06 % nasal spray Place 2 sprays into both nostrils 4 (four) times daily. 15 mL 0   losartan (COZAAR) 50 MG tablet Take 1 tablet (50 mg total) by mouth daily. 90 tablet 3   Multiple Vitamin (MULTIVITAMIN WITH MINERALS) TABS tablet Take 1 tablet by mouth daily.     rosuvastatin (CRESTOR) 10 MG tablet Take 1 tablet (10 mg total) by mouth daily. 90 tablet 3   diphenhydrAMINE (BENADRYL) 25 MG tablet Take 25 mg by mouth every 6 (six) hours as needed for allergies.     Current Facility-Administered Medications  Medication Dose  Route Frequency Provider Last Rate Last Admin   0.9 %  sodium chloride infusion  500 mL Intravenous Once Sharyn Creamer, MD        Allergies as of 01/14/2021 - Review Complete 01/14/2021  Allergen Reaction Noted   Penicillins Hives and Itching 01/21/2011   Sudafed [pseudoephedrine hcl] Palpitations 09/24/2013    Family History  Problem Relation Age of Onset   Diabetes Mother    Hypertension Mother    Hypertension Father    Cancer Father    Colon cancer Neg Hx    Colon polyps Neg Hx    Esophageal cancer Neg Hx    Rectal cancer Neg Hx    Stomach cancer Neg Hx     Social History   Socioeconomic History   Marital status: Single    Spouse name:  Not on file   Number of children: Not on file   Years of education: Not on file   Highest education level: Not on file  Occupational History   Not on file  Tobacco Use   Smoking status: Never   Smokeless tobacco: Never  Vaping Use   Vaping Use: Never used  Substance and Sexual Activity   Alcohol use: No   Drug use: No   Sexual activity: Not on file  Other Topics Concern   Not on file  Social History Narrative   Not on file   Social Determinants of Health   Financial Resource Strain: Not on file  Food Insecurity: Not on file  Transportation Needs: Not on file  Physical Activity: Not on file  Stress: Not on file  Social Connections: Not on file  Intimate Partner Violence: Not on file    Physical Exam: Vital signs in last 24 hours: BP 129/74    Pulse 73    Temp 98 F (36.7 C)    Resp 10    Ht 5\' 7"  (1.702 m)    Wt 192 lb (87.1 kg)    LMP 01/31/2017    SpO2 100%    BMI 30.07 kg/m  GEN: NAD EYE: Sclerae anicteric ENT: MMM CV: Non-tachycardic Pulm: No increased work of breathing GI: Soft, NT/ND NEURO:  Alert & Oriented   [name], MD [facility]  01/14/2021 8:45 AM

## 2021-01-14 NOTE — Telephone Encounter (Signed)
Urgent request has been sent to Dr. Lorenso Courier. Awaiting her response.

## 2021-01-14 NOTE — Progress Notes (Signed)
Pt's states no medical or surgical changes since previsit or office visit. 

## 2021-01-15 ENCOUNTER — Telehealth: Payer: Self-pay | Admitting: Internal Medicine

## 2021-01-15 ENCOUNTER — Encounter: Payer: Self-pay | Admitting: Internal Medicine

## 2021-01-15 ENCOUNTER — Other Ambulatory Visit: Payer: Self-pay | Admitting: Internal Medicine

## 2021-01-15 ENCOUNTER — Ambulatory Visit (HOSPITAL_BASED_OUTPATIENT_CLINIC_OR_DEPARTMENT_OTHER)
Admission: RE | Admit: 2021-01-15 | Discharge: 2021-01-15 | Disposition: A | Discharge status: Home / Self Care | Payer: 59 | Source: Ambulatory Visit | Attending: Internal Medicine | Admitting: Internal Medicine

## 2021-01-15 DIAGNOSIS — K6389 Other specified diseases of intestine: Secondary | ICD-10-CM

## 2021-01-15 MED ORDER — IOHEXOL 300 MG/ML  SOLN
100.0000 mL | Freq: Once | INTRAMUSCULAR | Status: AC | PRN
  Administered 2021-01-15: 100 mL via INTRAVENOUS

## 2021-01-15 NOTE — Telephone Encounter (Signed)
Called the patient to go over the results of her CT imaging. Her CT scan showed the cecal polyp/mass and nonspecific prominence of a lymph node (unclear significance at this time). We are still waiting on the pathology results from her colonoscopy, and she is scheduled to see Dr. Marcello Moores with surgery next Monday 1/16 for consideration of colon resection and lymph node sampling.

## 2021-01-18 ENCOUNTER — Ambulatory Visit: Payer: Self-pay | Admitting: General Surgery

## 2021-01-18 ENCOUNTER — Telehealth: Payer: Self-pay

## 2021-01-18 DIAGNOSIS — K639 Disease of intestine, unspecified: Secondary | ICD-10-CM

## 2021-01-18 DIAGNOSIS — K6389 Other specified diseases of intestine: Secondary | ICD-10-CM | POA: Diagnosis not present

## 2021-01-18 NOTE — H&P (View-Only) (Signed)
REFERRING PHYSICIAN:  Georgian Co, MD   PROVIDER:  Monico Blitz, MD   MRN: [id number] DOB: Dec 18, 1965 DATE OF ENCOUNTER: 01/18/2021   Subjective    Chief Complaint: New Patient       History of Present Illness: Gail Lloyd is a 56 y.o. female who is seen today as an office consultation at the request of Dr. Lorenso Courier for evaluation of New Patient .   56 year old female who presents to the office for evaluation of a colon mass seen on patient's for screening colonoscopy.  There was a large polypoid mass found carpeting the cecum.  This was biopsied.  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  CT chest abdomen pelvis were completed.  This shows some cecal wall thickening and a 5 mm mesocolic lymph node.  No other metastatic disease noted.     Review of Systems: A complete review of systems was obtained from the patient.  I have reviewed this information and discussed as appropriate with the patient.  See HPI as well for other ROS.       Medical History: Past Medical History      Past Medical History:  Diagnosis Date   Hyperlipidemia     Hypertension             Patient Active Problem List  Diagnosis   Diet-controlled diabetes mellitus (CMS-HCC)      Past Surgical History  History reviewed. No pertinent surgical history.      Allergies      Allergies  Allergen Reactions   Penicillins Hives and Itching   Pseudoephedrine Hcl Palpitations              Current Outpatient Medications on File Prior to Visit  Medication Sig Dispense Refill   amLODIPine (NORVASC) 5 MG tablet Take 5 mg by mouth once daily       carvediloL (COREG) 6.25 MG tablet Take by mouth       rosuvastatin (CRESTOR) 10 MG tablet Take 1 tablet by mouth once daily        No current facility-administered medications on file prior to visit.      Family History       Family History  Problem Relation Age of Onset   High blood pressure (Hypertension) Mother      Diabetes Mother     High blood pressure (Hypertension) Father     Diabetes Father          Social History       Tobacco Use  Smoking Status Never  Smokeless Tobacco Never      Social History  Social History        Socioeconomic History   Marital status: Single  Tobacco Use   Smoking status: Never   Smokeless tobacco: Never  Vaping Use   Vaping Use: Never used  Substance and Sexual Activity   Alcohol use: Never   Drug use: Never        Objective:         Vitals:    01/18/21 1409  BP: 110/70  Pulse: 105  Temp: 36.2 C (97.2 F)  SpO2: 96%  Weight: 86.4 kg (190 lb 6.4 oz)  Height: 170.2 cm (5\' 7" )      Exam Gen: NAD CV: RRR Lungs: CTA Abd: soft, nondistended       Labs, Imaging and Diagnostic Testing: CT scans of chest abdomen and pelvis reviewed personally.  Patient has cecal wall thickening noted around the area of the  appendiceal orifice and tracking up the lower third of the cecum.   Assessment and Plan:  Diagnoses and all orders for this visit:   Cecum mass -     polyethylene glycol (MIRALAX) powder; Take 233.75 g by mouth once for 1 dose Take according to your procedure prep instructions. -     bisacodyL (DULCOLAX) 5 mg EC tablet; Take 4 tablets (20 mg total) by mouth once for 1 dose -     metroNIDAZOLE (FLAGYL) 500 MG tablet; Take 2 tablets (1,000 mg total) by mouth 3 (three) times daily for 6 doses Take according to your procedure colon prep instructions -     neomycin 500 mg tablet; Take 2 tablets (1,000 mg total) by mouth 3 (three) times daily for 3 doses Take according to your procedure colon prep instructions     Patient has a large carpeting cecal polyp.  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  We discussed that there still can be some residual carcinoma within the larger polyps.  Therefore I recommend proceeding with a robotic assisted right colectomy with lymph node harvest.  We have discussed this in detail  including risk and benefits of surgery as well as long-term complications and need for additional surveillance endoscopy.  We also discussed typical postoperative recovery time and return to work.  All questions were answered.  Patient would like to proceed with surgery.   The surgery and anatomy were described to the patient as well as the risks of surgery and the possible complications.  These include: Bleeding, deep abdominal infections and possible wound complications such as hernia and infection, damage to adjacent structures, leak of surgical connections, which can lead to other surgeries and possibly an ostomy, possible need for other procedures, such as abscess drains in radiology, possible prolonged hospital stay, possible diarrhea from removal of part of the colon, possible constipation from narcotics, possible bowel, bladder or sexual dysfunction if having rectal surgery, prolonged fatigue/weakness or appetite loss, possible early recurrence of of disease, possible complications of their medical problems such as heart disease or arrhythmias or lung problems, death (less than 1%). I believe the patient understands and wishes to proceed with the surgery.

## 2021-01-18 NOTE — H&P (Signed)
REFERRING PHYSICIAN:  Georgian Co, MD   PROVIDER:  Monico Blitz, MD   MRN: [id number] DOB: 05-09-1965 DATE OF ENCOUNTER: 01/18/2021   Subjective    Chief Complaint: New Patient       History of Present Illness: Gail Lloyd is a 56 y.o. female who is seen today as an office consultation at the request of Dr. Lorenso Courier for evaluation of New Patient .   56 year old female who presents to the office for evaluation of a colon mass seen on patient's for screening colonoscopy.  There was a large polypoid mass found carpeting the cecum.  This was biopsied.  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  CT chest abdomen pelvis were completed.  This shows some cecal wall thickening and a 5 mm mesocolic lymph node.  No other metastatic disease noted.     Review of Systems: A complete review of systems was obtained from the patient.  I have reviewed this information and discussed as appropriate with the patient.  See HPI as well for other ROS.       Medical History: Past Medical History      Past Medical History:  Diagnosis Date   Hyperlipidemia     Hypertension             Patient Active Problem List  Diagnosis   Diet-controlled diabetes mellitus (CMS-HCC)      Past Surgical History  History reviewed. No pertinent surgical history.      Allergies      Allergies  Allergen Reactions   Penicillins Hives and Itching   Pseudoephedrine Hcl Palpitations              Current Outpatient Medications on File Prior to Visit  Medication Sig Dispense Refill   amLODIPine (NORVASC) 5 MG tablet Take 5 mg by mouth once daily       carvediloL (COREG) 6.25 MG tablet Take by mouth       rosuvastatin (CRESTOR) 10 MG tablet Take 1 tablet by mouth once daily        No current facility-administered medications on file prior to visit.      Family History       Family History  Problem Relation Age of Onset   High blood pressure (Hypertension) Mother      Diabetes Mother     High blood pressure (Hypertension) Father     Diabetes Father          Social History       Tobacco Use  Smoking Status Never  Smokeless Tobacco Never      Social History  Social History        Socioeconomic History   Marital status: Single  Tobacco Use   Smoking status: Never   Smokeless tobacco: Never  Vaping Use   Vaping Use: Never used  Substance and Sexual Activity   Alcohol use: Never   Drug use: Never        Objective:         Vitals:    01/18/21 1409  BP: 110/70  Pulse: 105  Temp: 36.2 C (97.2 F)  SpO2: 96%  Weight: 86.4 kg (190 lb 6.4 oz)  Height: 170.2 cm (5\' 7" )      Exam Gen: NAD CV: RRR Lungs: CTA Abd: soft, nondistended       Labs, Imaging and Diagnostic Testing: CT scans of chest abdomen and pelvis reviewed personally.  Patient has cecal wall thickening noted around the area of the  appendiceal orifice and tracking up the lower third of the cecum.   Assessment and Plan:  Diagnoses and all orders for this visit:   Cecum mass -     polyethylene glycol (MIRALAX) powder; Take 233.75 g by mouth once for 1 dose Take according to your procedure prep instructions. -     bisacodyL (DULCOLAX) 5 mg EC tablet; Take 4 tablets (20 mg total) by mouth once for 1 dose -     metroNIDAZOLE (FLAGYL) 500 MG tablet; Take 2 tablets (1,000 mg total) by mouth 3 (three) times daily for 6 doses Take according to your procedure colon prep instructions -     neomycin 500 mg tablet; Take 2 tablets (1,000 mg total) by mouth 3 (three) times daily for 3 doses Take according to your procedure colon prep instructions     Patient has a large carpeting cecal polyp.  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  We discussed that there still can be some residual carcinoma within the larger polyps.  Therefore I recommend proceeding with a robotic assisted right colectomy with lymph node harvest.  We have discussed this in detail  including risk and benefits of surgery as well as long-term complications and need for additional surveillance endoscopy.  We also discussed typical postoperative recovery time and return to work.  All questions were answered.  Patient would like to proceed with surgery.   The surgery and anatomy were described to the patient as well as the risks of surgery and the possible complications.  These include: Bleeding, deep abdominal infections and possible wound complications such as hernia and infection, damage to adjacent structures, leak of surgical connections, which can lead to other surgeries and possibly an ostomy, possible need for other procedures, such as abscess drains in radiology, possible prolonged hospital stay, possible diarrhea from removal of part of the colon, possible constipation from narcotics, possible bowel, bladder or sexual dysfunction if having rectal surgery, prolonged fatigue/weakness or appetite loss, possible early recurrence of of disease, possible complications of their medical problems such as heart disease or arrhythmias or lung problems, death (less than 1%). I believe the patient understands and wishes to proceed with the surgery.

## 2021-01-18 NOTE — Telephone Encounter (Signed)
°  Follow up Call-  Call back number 01/14/2021  Post procedure Call Back phone  # [phone]  Permission to leave phone message Yes  Some recent data might be hidden     Patient questions:  Do you have a fever, pain , or abdominal swelling? No. Pain Score  0 *  Have you tolerated food without any problems? Yes.    Have you been able to return to your normal activities? Yes.    Do you have any questions about your discharge instructions: Diet   No. Medications  No. Follow up visit  No.  Do you have questions or concerns about your Care? No.  Actions: * If pain score is 4 or above: No action needed, pain <4.  Have you developed a fever since your procedure? no  2.   Have you had an respiratory symptoms (SOB or cough) since your procedure? no  3.   Have you tested positive for COVID 19 since your procedure no  4.   Have you had any family members/close contacts diagnosed with the COVID 19 since your procedure?  no   If yes to any of these questions please route to Joylene John, RN and Joella Prince, RN

## 2021-01-28 ENCOUNTER — Other Ambulatory Visit: Payer: Self-pay | Admitting: Interventional Cardiology

## 2021-01-29 NOTE — Patient Instructions (Addendum)
DUE TO COVID-19 ONLY ONE VISITOR IS ALLOWED TO COME WITH YOU AND STAY IN THE WAITING ROOM ONLY DURING PRE OP AND PROCEDURE DAY OF SURGERY IF YOU ARE GOING HOME AFTER SURGERY. IF YOU ARE SPENDING THE NIGHT 2 PEOPLE MAY VISIT WITH YOU IN YOUR PRIVATE ROOM AFTER SURGERY UNTIL VISITING  HOURS ARE OVER AT 8:00 PM AND 1  VISITOR  MAY  SPEND THE NIGHT.   YOU NEED TO HAVE A COVID 19 TEST ON__2/13/23_____ @__9 :15_____, THIS TEST MUST BE DONE BEFORE SURGERY,  COVID TESTING SITE  IS LOCATED AT [place], [place]. REMAIN IN YOUR CAR THIS IS A DRIVE UP TEST. AFTER YOUR COVID TEST PLEASE WEAR A MASK OUT IN PUBLIC AND SOCIAL DISTANCE AND [place] YOUR HANDS FREQUENTLY, ALSO ASK ALL YOUR CLOSE CONTACT PERSONS TO WEAR A MASK AND SOCIAL DISTANCE AND [place] THEIR HANDS FREQUENTLY ALSO.               Gail Lloyd     Your procedure is scheduled on: 02/17/21   Report to [facility] Main  Entrance   Report to admitting at  9:15 AM     Call this number if you have problems the morning of surgery [phone]   Follow all instructions for diet and bowel prep from the dr's office  Drink plenty of fluids on the day of prep to prevent dehydration.  DRINK 2 PRESURGERY ENSURE DRINKS THE NIGHT BEFORE SURGERY AT 10:00 PM.   NO SOLIDS AFTER MIDNIGHT THE DAY PRIOR TO THE SURGERY.   NOTHING BY MOUTH EXCEPT CLEAR LIQUIDS UNTIL 8:30 AM  THREE HOURS PRIOR TO SCHEDULED SURGERY. PLEASE FINISH PRESURGERY ENSURE DRINK PER SURGEON ORDER 3 HOURS PRIOR TO SCHEDULED SURGERY TIME WHICH NEEDS TO BE COMPLETED AT __8:30 am_______.    CLEAR LIQUID DIET   Foods Allowed                                                                     Foods Excluded  Coffee and tea, regular and decaf                             liquids that you cannot  Plain Jell-O any favor except red or purple                                           see through such as: Fruit ices (not with fruit pulp)                                      milk, soups, orange juice  Iced Popsicles                                    All solid food Carbonated beverages, regular and diet  Cranberry, grape and apple juices Sports drinks like Gatorade Lightly seasoned clear broth or consume(fat free) Sugar     BRUSH YOUR TEETH MORNING OF SURGERY AND RINSE YOUR MOUTH OUT, NO CHEWING GUM CANDY OR MINTS.     Take these medicines the morning of surgery with A SIP OF WATER: Carvedilol, Amlodipine                                You may not have any metal on your body including hair pins and              piercings  Do not wear jewelry, make-up, lotions, powders or perfumes, deodorant             Do not wear nail polish on your fingernails.  Do not shave  48 hours prior to surgery.                Do not bring valuables to the hospital. [place].  Contacts, dentures or bridgework may not be worn into surgery.  Leave suitcase in the car. After surgery it may be brought to your room.                 Please read over the following fact sheets you were given: _____________________________________________________________________             [facility] - Preparing for Surgery Before surgery, you can play an important role.  Because skin is not sterile, your skin needs to be as free of germs as possible.  You can reduce the number of germs on your skin by washing with CHG (chlorahexidine gluconate) soap before surgery.  CHG is an antiseptic cleaner which kills germs and bonds with the skin to continue killing germs even after washing. Please DO NOT use if you have an allergy to CHG or antibacterial soaps.  If your skin becomes reddened/irritated stop using the CHG and inform your nurse when you arrive at Short Stay. Do not shave (including legs and underarms) for at least 48 hours prior to the first CHG shower.    Please follow these instructions carefully:  1.   Shower with CHG Soap the night before surgery and the  morning of Surgery.  2.  If you choose to wash your hair, wash your hair first as usual with your  normal  shampoo.  3.  After you shampoo, rinse your hair and body thoroughly to remove the  shampoo.                            4.  Use CHG as you would any other liquid soap.  You can apply chg directly  to the skin and wash                       Gently with a scrungie or clean washcloth.  5.  Apply the CHG Soap to your body ONLY FROM THE NECK DOWN.   Do not use on face/ open                           Wound or open sores. Avoid contact with eyes, ears mouth and genitals (private parts).  Wash face,  Genitals (private parts) with your normal soap.             6.  Wash thoroughly, paying special attention to the area where your surgery  will be performed.  7.  Thoroughly rinse your body with warm water from the neck down.  8.  DO NOT shower/wash with your normal soap after using and rinsing off  the CHG Soap.                9.  Pat yourself dry with a clean towel.            10.  Wear clean pajamas.            11.  Place clean sheets on your bed the night of your first shower and do not  sleep with pets. Day of Surgery : Do not apply any lotions/deodorants the morning of surgery.  Please wear clean clothes to the hospital/surgery center.  FAILURE TO FOLLOW THESE INSTRUCTIONS MAY RESULT IN THE CANCELLATION OF YOUR SURGERY PATIENT SIGNATURE_________________________________  NURSE SIGNATURE__________________________________  ________________________________________________________________________   Gail Lloyd  An incentive spirometer is a tool that can help keep your lungs clear and active. This tool measures how well you are filling your lungs with each breath. Taking long deep breaths may help reverse or decrease the chance of developing breathing (pulmonary) problems (especially infection) following: A long  period of time when you are unable to move or be active. BEFORE THE PROCEDURE  If the spirometer includes an indicator to show your best effort, your nurse or respiratory therapist will set it to a desired goal. If possible, sit up straight or lean slightly forward. Try not to slouch. Hold the incentive spirometer in an upright position. INSTRUCTIONS FOR USE  Sit on the edge of your bed if possible, or sit up as far as you can in bed or on a chair. Hold the incentive spirometer in an upright position. Breathe out normally. Place the mouthpiece in your mouth and seal your lips tightly around it. Breathe in slowly and as deeply as possible, raising the piston or the ball toward the top of the column. Hold your breath for 3-5 seconds or for as long as possible. Allow the piston or ball to fall to the bottom of the column. Remove the mouthpiece from your mouth and breathe out normally. Rest for a few seconds and repeat Steps 1 through 7 at least 10 times every 1-2 hours when you are awake. Take your time and take a few normal breaths between deep breaths. The spirometer may include an indicator to show your best effort. Use the indicator as a goal to work toward during each repetition. After each set of 10 deep breaths, practice coughing to be sure your lungs are clear. If you have an incision (the cut made at the time of surgery), support your incision when coughing by placing a pillow or rolled up towels firmly against it. Once you are able to get out of bed, walk around indoors and cough well. You may stop using the incentive spirometer when instructed by your caregiver.  RISKS AND COMPLICATIONS Take your time so you do not get dizzy or light-headed. If you are in pain, you may need to take or ask for pain medication before doing incentive spirometry. It is harder to take a deep breath if you are having pain. AFTER USE Rest and breathe slowly and easily. It can be helpful to keep track of  a log  of your progress. Your caregiver can provide you with a simple table to help with this. If you are using the spirometer at home, follow these instructions: [place] IF:  You are having difficultly using the spirometer. You have trouble using the spirometer as often as instructed. Your pain medication is not giving enough relief while using the spirometer. You develop fever of 100.5 F (38.1 C) or higher. SEEK IMMEDIATE MEDICAL CARE IF:  You cough up bloody sputum that had not been present before. You develop fever of 102 F (38.9 C) or greater. You develop worsening pain at or near the incision site. MAKE SURE YOU:  Understand these instructions. Will watch your condition. Will get help right away if you are not doing well or get worse. Document Released: 05/02/2006 Document Revised: 03/14/2011 Document Reviewed: 07/03/2006 [facility] Patient Information 2014 [place], [place].   ________________________________________________________________________

## 2021-02-01 ENCOUNTER — Encounter (HOSPITAL_COMMUNITY)
Admission: RE | Admit: 2021-02-01 | Discharge: 2021-02-01 | Disposition: A | Discharge status: Home / Self Care | Payer: 59 | Source: Ambulatory Visit | Attending: General Surgery | Admitting: General Surgery

## 2021-02-01 ENCOUNTER — Other Ambulatory Visit: Payer: Self-pay

## 2021-02-01 ENCOUNTER — Encounter (HOSPITAL_COMMUNITY): Payer: Self-pay

## 2021-02-01 VITALS — BP 134/74 | HR 79 | Temp 98.8°F | Resp 18 | Ht 67.0 in | Wt 185.0 lb

## 2021-02-01 DIAGNOSIS — Z01812 Encounter for preprocedural laboratory examination: Secondary | ICD-10-CM | POA: Insufficient documentation

## 2021-02-01 DIAGNOSIS — K639 Disease of intestine, unspecified: Secondary | ICD-10-CM | POA: Insufficient documentation

## 2021-02-01 DIAGNOSIS — R7303 Prediabetes: Secondary | ICD-10-CM | POA: Diagnosis not present

## 2021-02-01 DIAGNOSIS — Z01818 Encounter for other preprocedural examination: Secondary | ICD-10-CM

## 2021-02-01 HISTORY — DX: Prediabetes: R73.03

## 2021-02-01 LAB — BASIC METABOLIC PANEL
Anion gap: 7 (ref 5–15)
BUN: 24 mg/dL — ABNORMAL HIGH (ref 6–20)
CO2: 27 mmol/L (ref 22–32)
Calcium: 9.4 mg/dL (ref 8.9–10.3)
Chloride: 104 mmol/L (ref 98–111)
Creatinine, Ser: 0.73 mg/dL (ref 0.44–1.00)
GFR, Estimated: >60 mL/min (ref >60)
Glucose, Bld: 119 mg/dL — ABNORMAL HIGH (ref 70–99)
Potassium: 3.7 mmol/L (ref 3.5–5.1)
Sodium: 138 mmol/L (ref 135–145)

## 2021-02-01 LAB — CBC
HCT: 37.8 % (ref 36.0–46.0)
Hemoglobin: 12.5 g/dL (ref 12.0–15.0)
MCH: 29.9 pg (ref 26.0–34.0)
MCHC: 33.1 g/dL (ref 30.0–36.0)
MCV: 90.4 fL (ref 80.0–100.0)
Platelets: 290 10*3/uL (ref 150–400)
RBC: 4.18 MIL/uL (ref 3.87–5.11)
RDW: 13.2 % (ref 11.5–15.5)
WBC: 6.8 10*3/uL (ref 4.0–10.5)
nRBC: 0 % (ref 0.0–0.2)

## 2021-02-01 LAB — HEMOGLOBIN A1C
Hgb A1c MFr Bld: 6.3 % — ABNORMAL HIGH (ref 4.8–5.6)
Mean Plasma Glucose: 134.11 mg/dL

## 2021-02-01 LAB — GLUCOSE, CAPILLARY: Glucose-Capillary: 126 mg/dL — ABNORMAL HIGH (ref 70–99)

## 2021-02-01 NOTE — Progress Notes (Signed)
COVID test- 02/15/21 at 9:15 am   PCP - Dr. Lockie Pares Cardiologist - Dr. Lendell Caprice  Chest x-ray - 10/01/19-epic EKG - 07/15/20-epic Stress Test - no ECHO - no Cardiac Cath - no Pacemaker/ICD device last checked:NA  Sleep Study - no CPAP -   Fasting Blood Sugar - NA Checks Blood Sugar _____ times a day  Blood Thinner Instructions:NA Aspirin Instructions: Last Dose:  Anesthesia review: yes  Patient denies shortness of breath, fever, cough and chest pain at PAT appointment Pt denies SOB with any activities  Patient verbalized understanding of instructions that were given to them at the PAT appointment. Patient was also instructed that they will need to review over the PAT instructions again at home before surgery. yes

## 2021-02-03 LAB — CEA: CEA: 2.3 ng/mL (ref 0.0–4.7)

## 2021-02-04 DIAGNOSIS — J3489 Other specified disorders of nose and nasal sinuses: Secondary | ICD-10-CM | POA: Diagnosis not present

## 2021-02-04 DIAGNOSIS — J343 Hypertrophy of nasal turbinates: Secondary | ICD-10-CM | POA: Diagnosis not present

## 2021-02-04 DIAGNOSIS — R0981 Nasal congestion: Secondary | ICD-10-CM | POA: Diagnosis not present

## 2021-02-11 ENCOUNTER — Telehealth: Payer: Self-pay | Admitting: Family Medicine

## 2021-02-11 DIAGNOSIS — I10 Essential (primary) hypertension: Secondary | ICD-10-CM | POA: Diagnosis not present

## 2021-02-11 NOTE — Telephone Encounter (Signed)
Patient wanted to let Dr.Banks know that she had an episode where she had to call the paramedics due to her feeling flushed and experienced an elevated blood pressure. Paramedics ran an EKG and found nothing to be wrong.  Patient could be contacted at [phone].  Please advise.

## 2021-02-12 NOTE — Telephone Encounter (Signed)
LVM instructions for pt to schedule appt with Dr Volanda Napoleon prior to her 03/05/21 appt if needed.

## 2021-02-15 ENCOUNTER — Encounter (HOSPITAL_COMMUNITY)
Admission: RE | Admit: 2021-02-15 | Discharge: 2021-02-15 | Disposition: A | Discharge status: Home / Self Care | Payer: 59 | Source: Ambulatory Visit | Attending: General Surgery | Admitting: General Surgery

## 2021-02-15 ENCOUNTER — Other Ambulatory Visit: Payer: Self-pay

## 2021-02-15 DIAGNOSIS — Z20822 Contact with and (suspected) exposure to covid-19: Secondary | ICD-10-CM | POA: Insufficient documentation

## 2021-02-15 DIAGNOSIS — Z01812 Encounter for preprocedural laboratory examination: Secondary | ICD-10-CM

## 2021-02-15 LAB — SARS CORONAVIRUS 2 (TAT 6-24 HRS): SARS Coronavirus 2: NEGATIVE

## 2021-02-17 ENCOUNTER — Inpatient Hospital Stay (HOSPITAL_COMMUNITY)
Admission: RE | Admit: 2021-02-17 | Discharge: 2021-02-19 | DRG: 331 | Disposition: A | Discharge status: Home / Self Care | Payer: 59 | Attending: General Surgery | Admitting: General Surgery

## 2021-02-17 ENCOUNTER — Inpatient Hospital Stay (HOSPITAL_COMMUNITY): Payer: 59 | Admitting: Physician Assistant

## 2021-02-17 ENCOUNTER — Encounter (HOSPITAL_COMMUNITY): Payer: Self-pay | Admitting: General Surgery

## 2021-02-17 ENCOUNTER — Inpatient Hospital Stay (HOSPITAL_COMMUNITY): Payer: 59 | Admitting: Anesthesiology

## 2021-02-17 ENCOUNTER — Encounter (HOSPITAL_COMMUNITY)
Admission: RE | Disposition: A | Discharge status: Home / Self Care | Payer: Self-pay | Source: Home / Self Care | Attending: General Surgery

## 2021-02-17 DIAGNOSIS — D122 Benign neoplasm of ascending colon: Secondary | ICD-10-CM | POA: Diagnosis not present

## 2021-02-17 DIAGNOSIS — K639 Disease of intestine, unspecified: Secondary | ICD-10-CM | POA: Diagnosis not present

## 2021-02-17 DIAGNOSIS — E785 Hyperlipidemia, unspecified: Secondary | ICD-10-CM | POA: Diagnosis present

## 2021-02-17 DIAGNOSIS — K635 Polyp of colon: Secondary | ICD-10-CM

## 2021-02-17 DIAGNOSIS — D120 Benign neoplasm of cecum: Secondary | ICD-10-CM | POA: Diagnosis present

## 2021-02-17 DIAGNOSIS — Z8249 Family history of ischemic heart disease and other diseases of the circulatory system: Secondary | ICD-10-CM

## 2021-02-17 DIAGNOSIS — Z888 Allergy status to other drugs, medicaments and biological substances status: Secondary | ICD-10-CM | POA: Diagnosis not present

## 2021-02-17 DIAGNOSIS — R7303 Prediabetes: Secondary | ICD-10-CM | POA: Diagnosis present

## 2021-02-17 DIAGNOSIS — Z880 Allergy status to penicillin: Secondary | ICD-10-CM

## 2021-02-17 DIAGNOSIS — I10 Essential (primary) hypertension: Secondary | ICD-10-CM | POA: Diagnosis present

## 2021-02-17 DIAGNOSIS — Z20822 Contact with and (suspected) exposure to covid-19: Secondary | ICD-10-CM | POA: Diagnosis present

## 2021-02-17 DIAGNOSIS — Z833 Family history of diabetes mellitus: Secondary | ICD-10-CM

## 2021-02-17 DIAGNOSIS — Z79899 Other long term (current) drug therapy: Secondary | ICD-10-CM | POA: Diagnosis not present

## 2021-02-17 DIAGNOSIS — D374 Neoplasm of uncertain behavior of colon: Secondary | ICD-10-CM | POA: Diagnosis not present

## 2021-02-17 LAB — TYPE AND SCREEN
ABO/RH(D): O POS
Antibody Screen: NEGATIVE

## 2021-02-17 LAB — GLUCOSE, CAPILLARY: Glucose-Capillary: 143 mg/dL — ABNORMAL HIGH (ref 70–99)

## 2021-02-17 LAB — ABO/RH: ABO/RH(D): O POS

## 2021-02-17 SURGERY — COLECTOMY, PARTIAL, ROBOT-ASSISTED, LAPAROSCOPIC
Anesthesia: General | Site: Abdomen

## 2021-02-17 MED ORDER — BUPIVACAINE-EPINEPHRINE (PF) 0.25% -1:200000 IJ SOLN
INTRAMUSCULAR | Status: AC
  Filled 2021-02-17: qty 30

## 2021-02-17 MED ORDER — ENSURE PRE-SURGERY PO LIQD
296.0000 mL | Freq: Once | ORAL | Status: DC
  Filled 2021-02-17: qty 296

## 2021-02-17 MED ORDER — BUPIVACAINE LIPOSOME 1.3 % IJ SUSP
INTRAMUSCULAR | Status: DC | PRN
  Administered 2021-02-17: 20 mL

## 2021-02-17 MED ORDER — BUPIVACAINE LIPOSOME 1.3 % IJ SUSP: 20.0000 mL | Freq: Once | INTRAMUSCULAR | Status: DC

## 2021-02-17 MED ORDER — PHENYLEPHRINE 40 MCG/ML (10ML) SYRINGE FOR IV PUSH (FOR BLOOD PRESSURE SUPPORT)
PREFILLED_SYRINGE | INTRAVENOUS | Status: DC | PRN
  Administered 2021-02-17: 120 ug via INTRAVENOUS
  Administered 2021-02-17: 80 ug via INTRAVENOUS

## 2021-02-17 MED ORDER — GABAPENTIN 300 MG PO CAPS
300.0000 mg | ORAL_CAPSULE | Freq: Two times a day (BID) | ORAL | Status: DC
  Administered 2021-02-17 – 2021-02-19 (×4): 300 mg via ORAL
  Filled 2021-02-17 (×4): qty 1

## 2021-02-17 MED ORDER — ONDANSETRON HCL 4 MG/2ML IJ SOLN: 4.0000 mg | Freq: Four times a day (QID) | INTRAMUSCULAR | Status: DC | PRN

## 2021-02-17 MED ORDER — ALUM & MAG HYDROXIDE-SIMETH 200-200-20 MG/5ML PO SUSP: 30.0000 mL | Freq: Four times a day (QID) | ORAL | Status: DC | PRN

## 2021-02-17 MED ORDER — GLYCOPYRROLATE 0.2 MG/ML IJ SOLN
INTRAMUSCULAR | Status: AC
  Filled 2021-02-17: qty 1

## 2021-02-17 MED ORDER — FENTANYL CITRATE PF 50 MCG/ML IJ SOSY: 25.0000 ug | PREFILLED_SYRINGE | INTRAMUSCULAR | Status: DC | PRN

## 2021-02-17 MED ORDER — 0.9 % SODIUM CHLORIDE (POUR BTL) OPTIME
TOPICAL | Status: DC | PRN
  Administered 2021-02-17: 2000 mL

## 2021-02-17 MED ORDER — MIDAZOLAM HCL 5 MG/5ML IJ SOLN
INTRAMUSCULAR | Status: DC | PRN
  Administered 2021-02-17: 2 mg via INTRAVENOUS

## 2021-02-17 MED ORDER — LACTATED RINGERS IV SOLN: INTRAVENOUS | Status: DC

## 2021-02-17 MED ORDER — DEXAMETHASONE SODIUM PHOSPHATE 10 MG/ML IJ SOLN
INTRAMUSCULAR | Status: AC
  Filled 2021-02-17: qty 1

## 2021-02-17 MED ORDER — ENSURE PRE-SURGERY PO LIQD
592.0000 mL | Freq: Once | ORAL | Status: DC
  Filled 2021-02-17: qty 592

## 2021-02-17 MED ORDER — DIPHENHYDRAMINE HCL 25 MG PO CAPS: 25.0000 mg | ORAL_CAPSULE | Freq: Four times a day (QID) | ORAL | Status: DC | PRN

## 2021-02-17 MED ORDER — FLUTICASONE PROPIONATE 50 MCG/ACT NA SUSP: 1.0000 | Freq: Every day | NASAL | Status: DC

## 2021-02-17 MED ORDER — LIDOCAINE 2% (20 MG/ML) 5 ML SYRINGE
INTRAMUSCULAR | Status: DC | PRN
  Administered 2021-02-17: 1.5 mg/kg/h via INTRAVENOUS

## 2021-02-17 MED ORDER — ACETAMINOPHEN 500 MG PO TABS: 1000.0000 mg | ORAL_TABLET | Freq: Once | ORAL | Status: DC

## 2021-02-17 MED ORDER — HYDROMORPHONE HCL 1 MG/ML IJ SOLN: 0.5000 mg | INTRAMUSCULAR | Status: DC | PRN

## 2021-02-17 MED ORDER — AMLODIPINE BESYLATE 5 MG PO TABS
5.0000 mg | ORAL_TABLET | Freq: Every day | ORAL | Status: DC
  Administered 2021-02-19: 5 mg via ORAL
  Filled 2021-02-17 (×2): qty 1

## 2021-02-17 MED ORDER — PROMETHAZINE HCL 25 MG/ML IJ SOLN: 6.2500 mg | INTRAMUSCULAR | Status: DC | PRN

## 2021-02-17 MED ORDER — CELECOXIB 200 MG PO CAPS
200.0000 mg | ORAL_CAPSULE | Freq: Once | ORAL | Status: AC
  Administered 2021-02-17: 200 mg via ORAL
  Filled 2021-02-17: qty 1

## 2021-02-17 MED ORDER — LIDOCAINE HCL (PF) 2 % IJ SOLN
INTRAMUSCULAR | Status: AC
  Filled 2021-02-17: qty 5

## 2021-02-17 MED ORDER — EPHEDRINE 5 MG/ML INJ
INTRAVENOUS | Status: AC
  Filled 2021-02-17: qty 5

## 2021-02-17 MED ORDER — SODIUM CHLORIDE 0.9 % IV SOLN
2.0000 g | INTRAVENOUS | Status: AC
  Administered 2021-02-17: 2 g via INTRAVENOUS
  Filled 2021-02-17: qty 2

## 2021-02-17 MED ORDER — IPRATROPIUM BROMIDE 0.06 % NA SOLN
2.0000 | Freq: Two times a day (BID) | NASAL | Status: DC | PRN
  Filled 2021-02-17: qty 15

## 2021-02-17 MED ORDER — IBUPROFEN 400 MG PO TABS: 600.0000 mg | ORAL_TABLET | Freq: Four times a day (QID) | ORAL | Status: DC | PRN

## 2021-02-17 MED ORDER — ALVIMOPAN 12 MG PO CAPS
12.0000 mg | ORAL_CAPSULE | ORAL | Status: AC
  Administered 2021-02-17: 12 mg via ORAL
  Filled 2021-02-17: qty 1

## 2021-02-17 MED ORDER — PHENYLEPHRINE 40 MCG/ML (10ML) SYRINGE FOR IV PUSH (FOR BLOOD PRESSURE SUPPORT)
PREFILLED_SYRINGE | INTRAVENOUS | Status: AC
  Filled 2021-02-17: qty 10

## 2021-02-17 MED ORDER — ONDANSETRON HCL 4 MG/2ML IJ SOLN
INTRAMUSCULAR | Status: DC | PRN
  Administered 2021-02-17: 4 mg via INTRAVENOUS

## 2021-02-17 MED ORDER — FENTANYL CITRATE (PF) 100 MCG/2ML IJ SOLN
INTRAMUSCULAR | Status: AC
  Filled 2021-02-17: qty 2

## 2021-02-17 MED ORDER — DEXAMETHASONE SODIUM PHOSPHATE 4 MG/ML IJ SOLN
INTRAMUSCULAR | Status: DC | PRN
  Administered 2021-02-17: 8 mg via INTRAVENOUS

## 2021-02-17 MED ORDER — ACETAMINOPHEN 500 MG PO TABS
1000.0000 mg | ORAL_TABLET | Freq: Four times a day (QID) | ORAL | Status: DC
  Administered 2021-02-17 – 2021-02-19 (×8): 1000 mg via ORAL
  Filled 2021-02-17 (×8): qty 2

## 2021-02-17 MED ORDER — PHENYLEPHRINE HCL (PRESSORS) 10 MG/ML IV SOLN
INTRAVENOUS | Status: AC
  Filled 2021-02-17: qty 1

## 2021-02-17 MED ORDER — LACTATED RINGERS IR SOLN
Status: DC | PRN
  Administered 2021-02-17: 1000 mL

## 2021-02-17 MED ORDER — ONDANSETRON HCL 4 MG PO TABS: 4.0000 mg | ORAL_TABLET | Freq: Four times a day (QID) | ORAL | Status: DC | PRN

## 2021-02-17 MED ORDER — ENOXAPARIN SODIUM 40 MG/0.4ML IJ SOSY
40.0000 mg | PREFILLED_SYRINGE | INTRAMUSCULAR | Status: DC
  Administered 2021-02-18 – 2021-02-19 (×2): 40 mg via SUBCUTANEOUS
  Filled 2021-02-17 (×2): qty 0.4

## 2021-02-17 MED ORDER — TRAMADOL HCL 50 MG PO TABS: 50.0000 mg | ORAL_TABLET | Freq: Four times a day (QID) | ORAL | Status: DC | PRN

## 2021-02-17 MED ORDER — ROCURONIUM BROMIDE 10 MG/ML (PF) SYRINGE
PREFILLED_SYRINGE | INTRAVENOUS | Status: AC
  Filled 2021-02-17: qty 10

## 2021-02-17 MED ORDER — ACETAMINOPHEN 500 MG PO TABS
1000.0000 mg | ORAL_TABLET | ORAL | Status: AC
  Administered 2021-02-17: 1000 mg via ORAL
  Filled 2021-02-17: qty 2

## 2021-02-17 MED ORDER — SACCHAROMYCES BOULARDII 250 MG PO CAPS
250.0000 mg | ORAL_CAPSULE | Freq: Two times a day (BID) | ORAL | Status: DC
  Administered 2021-02-17 – 2021-02-19 (×4): 250 mg via ORAL
  Filled 2021-02-17 (×4): qty 1

## 2021-02-17 MED ORDER — PROPOFOL 10 MG/ML IV BOLUS
INTRAVENOUS | Status: AC
  Filled 2021-02-17: qty 20

## 2021-02-17 MED ORDER — SODIUM CHLORIDE 0.9 % IV SOLN
2.0000 g | Freq: Two times a day (BID) | INTRAVENOUS | Status: AC
  Administered 2021-02-18: 2 g via INTRAVENOUS
  Filled 2021-02-17: qty 2

## 2021-02-17 MED ORDER — BUPIVACAINE LIPOSOME 1.3 % IJ SUSP
INTRAMUSCULAR | Status: AC
  Filled 2021-02-17: qty 20

## 2021-02-17 MED ORDER — ORAL CARE MOUTH RINSE: 15.0000 mL | Freq: Once | OROMUCOSAL | Status: AC

## 2021-02-17 MED ORDER — EPHEDRINE SULFATE-NACL 50-0.9 MG/10ML-% IV SOSY
PREFILLED_SYRINGE | INTRAVENOUS | Status: DC | PRN
Start: 2021-02-17 — End: 2021-02-17
  Administered 2021-02-17: 10 mg via INTRAVENOUS

## 2021-02-17 MED ORDER — PHENYLEPHRINE HCL-NACL 20-0.9 MG/250ML-% IV SOLN
INTRAVENOUS | Status: DC | PRN
  Administered 2021-02-17: 40 ug/min via INTRAVENOUS

## 2021-02-17 MED ORDER — OXYCODONE HCL 5 MG/5ML PO SOLN: 5.0000 mg | Freq: Once | ORAL | Status: DC | PRN

## 2021-02-17 MED ORDER — PROPOFOL 10 MG/ML IV BOLUS
INTRAVENOUS | Status: DC | PRN
  Administered 2021-02-17: 160 mg via INTRAVENOUS

## 2021-02-17 MED ORDER — FENTANYL CITRATE (PF) 250 MCG/5ML IJ SOLN
INTRAMUSCULAR | Status: DC | PRN
Start: 2021-02-17 — End: 2021-02-17
  Administered 2021-02-17: 100 ug via INTRAVENOUS

## 2021-02-17 MED ORDER — KCL IN DEXTROSE-NACL 20-5-0.45 MEQ/L-%-% IV SOLN: INTRAVENOUS | Status: DC

## 2021-02-17 MED ORDER — SIMETHICONE 80 MG PO CHEW: 40.0000 mg | CHEWABLE_TABLET | Freq: Four times a day (QID) | ORAL | Status: DC | PRN

## 2021-02-17 MED ORDER — GABAPENTIN 300 MG PO CAPS
300.0000 mg | ORAL_CAPSULE | ORAL | Status: AC
  Administered 2021-02-17: 300 mg via ORAL
  Filled 2021-02-17: qty 1

## 2021-02-17 MED ORDER — KCL IN DEXTROSE-NACL 20-5-0.45 MEQ/L-%-% IV SOLN
INTRAVENOUS | Status: AC
Start: 2021-02-17 — End: 2021-02-17
  Filled 2021-02-17: qty 1000

## 2021-02-17 MED ORDER — CHLORHEXIDINE GLUCONATE 0.12 % MT SOLN
15.0000 mL | Freq: Once | OROMUCOSAL | Status: AC
  Administered 2021-02-17: 15 mL via OROMUCOSAL

## 2021-02-17 MED ORDER — LOSARTAN POTASSIUM 50 MG PO TABS
50.0000 mg | ORAL_TABLET | Freq: Every day | ORAL | Status: DC
  Administered 2021-02-18 – 2021-02-19 (×2): 50 mg via ORAL
  Filled 2021-02-17 (×2): qty 1

## 2021-02-17 MED ORDER — ALVIMOPAN 12 MG PO CAPS
12.0000 mg | ORAL_CAPSULE | Freq: Two times a day (BID) | ORAL | Status: DC
  Administered 2021-02-18: 12 mg via ORAL
  Filled 2021-02-17: qty 1

## 2021-02-17 MED ORDER — GLYCOPYRROLATE 0.2 MG/ML IJ SOLN
INTRAMUSCULAR | Status: DC | PRN
  Administered 2021-02-17: .2 mg via INTRAVENOUS

## 2021-02-17 MED ORDER — CARVEDILOL 6.25 MG PO TABS
6.2500 mg | ORAL_TABLET | Freq: Two times a day (BID) | ORAL | Status: DC
  Administered 2021-02-17 – 2021-02-19 (×4): 6.25 mg via ORAL
  Filled 2021-02-17 (×4): qty 1

## 2021-02-17 MED ORDER — BUPIVACAINE-EPINEPHRINE 0.25% -1:200000 IJ SOLN
INTRAMUSCULAR | Status: DC | PRN
  Administered 2021-02-17: 30 mL

## 2021-02-17 MED ORDER — ROCURONIUM BROMIDE 10 MG/ML (PF) SYRINGE
PREFILLED_SYRINGE | INTRAVENOUS | Status: DC | PRN
  Administered 2021-02-17: 20 mg via INTRAVENOUS
  Administered 2021-02-17: 60 mg via INTRAVENOUS

## 2021-02-17 MED ORDER — MIDAZOLAM HCL 2 MG/2ML IJ SOLN
INTRAMUSCULAR | Status: AC
  Filled 2021-02-17: qty 2

## 2021-02-17 MED ORDER — OXYCODONE HCL 5 MG PO TABS: 5.0000 mg | ORAL_TABLET | Freq: Once | ORAL | Status: DC | PRN

## 2021-02-17 MED ORDER — ENSURE SURGERY PO LIQD
237.0000 mL | Freq: Two times a day (BID) | ORAL | Status: DC
  Administered 2021-02-18 – 2021-02-19 (×2): 237 mL via ORAL

## 2021-02-17 SURGICAL SUPPLY — 95 items
BAG COUNTER SPONGE SURGICOUNT (BAG) ×2 IMPLANT
BLADE EXTENDED COATED 6.5IN (ELECTRODE) IMPLANT
CANNULA REDUC XI 12-8 STAPL (CANNULA)
CANNULA REDUCER 12-8 DVNC XI (CANNULA) IMPLANT
CELLS DAT CNTRL 66122 CELL SVR (MISCELLANEOUS) IMPLANT
COVER SURGICAL LIGHT HANDLE (MISCELLANEOUS) ×4 IMPLANT
COVER TIP SHEARS 8 DVNC (MISCELLANEOUS) ×1 IMPLANT
COVER TIP SHEARS 8MM DA VINCI (MISCELLANEOUS) ×1
DERMABOND ADVANCED (GAUZE/BANDAGES/DRESSINGS) ×1
DERMABOND ADVANCED .7 DNX12 (GAUZE/BANDAGES/DRESSINGS) IMPLANT
DRAIN CHANNEL 19F RND (DRAIN) IMPLANT
DRAPE ARM DVNC X/XI (DISPOSABLE) ×4 IMPLANT
DRAPE COLUMN DVNC XI (DISPOSABLE) ×1 IMPLANT
DRAPE DA VINCI XI ARM (DISPOSABLE) ×4
DRAPE DA VINCI XI COLUMN (DISPOSABLE) ×1
DRAPE SURG IRRIG POUCH 19X23 (DRAPES) ×2 IMPLANT
DRSG OPSITE POSTOP 4X10 (GAUZE/BANDAGES/DRESSINGS) IMPLANT
DRSG OPSITE POSTOP 4X6 (GAUZE/BANDAGES/DRESSINGS) ×1 IMPLANT
DRSG OPSITE POSTOP 4X8 (GAUZE/BANDAGES/DRESSINGS) IMPLANT
ELECT PENCIL ROCKER SW 15FT (MISCELLANEOUS) ×2 IMPLANT
ELECT REM PT RETURN 15FT ADLT (MISCELLANEOUS) ×2 IMPLANT
ENDOLOOP SUT PDS II  0 18 (SUTURE)
ENDOLOOP SUT PDS II 0 18 (SUTURE) IMPLANT
EVACUATOR SILICONE 100CC (DRAIN) IMPLANT
GLOVE SURG ENC MOIS LTX SZ6.5 (GLOVE) ×6 IMPLANT
GLOVE SURG UNDER LTX SZ6.5 (GLOVE) ×2 IMPLANT
GLOVE SURG UNDER POLY LF SZ7 (GLOVE) ×4 IMPLANT
GOWN STRL REUS W/TWL XL LVL3 (GOWN DISPOSABLE) ×6 IMPLANT
GRASPER SUT TROCAR 14GX15 (MISCELLANEOUS) IMPLANT
HOLDER FOLEY CATH W/STRAP (MISCELLANEOUS) ×2 IMPLANT
IRRIG SUCT STRYKERFLOW 2 WTIP (MISCELLANEOUS) ×2
IRRIGATION SUCT STRKRFLW 2 WTP (MISCELLANEOUS) ×1 IMPLANT
KIT PROCEDURE DA VINCI SI (MISCELLANEOUS)
KIT PROCEDURE DVNC SI (MISCELLANEOUS) IMPLANT
KIT TURNOVER KIT A (KITS) IMPLANT
NDL INSUFFLATION 14GA 120MM (NEEDLE) ×1 IMPLANT
NEEDLE INSUFFLATION 14GA 120MM (NEEDLE) ×2 IMPLANT
PACK CARDIOVASCULAR III (CUSTOM PROCEDURE TRAY) ×2 IMPLANT
PACK COLON (CUSTOM PROCEDURE TRAY) ×2 IMPLANT
PAD POSITIONING PINK XL (MISCELLANEOUS) ×2 IMPLANT
RELOAD STAPLE 60 2.5 WHT DVNC (STAPLE) IMPLANT
RELOAD STAPLE 60 3.5 BLU DVNC (STAPLE) IMPLANT
RELOAD STAPLE 60 4.3 GRN DVNC (STAPLE) IMPLANT
RELOAD STAPLER 2.5X60 WHT DVNC (STAPLE) ×1 IMPLANT
RELOAD STAPLER 3.5X60 BLU DVNC (STAPLE) ×1 IMPLANT
RELOAD STAPLER 4.3X60 GRN DVNC (STAPLE) ×1 IMPLANT
RETRACTOR WND ALEXIS 18 MED (MISCELLANEOUS) IMPLANT
RTRCTR WOUND ALEXIS 18CM MED (MISCELLANEOUS)
SCISSORS LAP 5X35 DISP (ENDOMECHANICALS) IMPLANT
SEAL CANN UNIV 5-8 DVNC XI (MISCELLANEOUS) ×3 IMPLANT
SEAL XI 5MM-8MM UNIVERSAL (MISCELLANEOUS) ×3
SEALER VESSEL DA VINCI XI (MISCELLANEOUS) ×1
SEALER VESSEL EXT DVNC XI (MISCELLANEOUS) ×1 IMPLANT
SOLUTION ELECTROLUBE (MISCELLANEOUS) ×2 IMPLANT
SPIKE FLUID TRANSFER (MISCELLANEOUS) IMPLANT
STAPLER 60 DA VINCI SURE FORM (STAPLE) ×1
STAPLER 60 SUREFORM DVNC (STAPLE) IMPLANT
STAPLER CANNULA SEAL DVNC XI (STAPLE) IMPLANT
STAPLER CANNULA SEAL XI (STAPLE) ×1
STAPLER ECHELON POWER CIR 29 (STAPLE) IMPLANT
STAPLER ECHELON POWER CIR 31 (STAPLE) IMPLANT
STAPLER RELOAD 2.5X60 WHITE (STAPLE) ×1
STAPLER RELOAD 2.5X60 WHT DVNC (STAPLE) ×1
STAPLER RELOAD 3.5X60 BLU DVNC (STAPLE) ×1
STAPLER RELOAD 3.5X60 BLUE (STAPLE) ×1
STAPLER RELOAD 4.3X60 GREEN (STAPLE) ×1
STAPLER RELOAD 4.3X60 GRN DVNC (STAPLE) ×1
STOPCOCK 4 WAY LG BORE MALE ST (IV SETS) ×4 IMPLANT
SUT ETHILON 2 0 PS N (SUTURE) IMPLANT
SUT NOVA NAB DX-16 0-1 5-0 T12 (SUTURE) ×4 IMPLANT
SUT PROLENE 2 0 KS (SUTURE) IMPLANT
SUT SILK 2 0 (SUTURE) ×1
SUT SILK 2 0 SH CR/8 (SUTURE) IMPLANT
SUT SILK 2-0 18XBRD TIE 12 (SUTURE) ×1 IMPLANT
SUT SILK 3 0 (SUTURE)
SUT SILK 3 0 SH CR/8 (SUTURE) ×2 IMPLANT
SUT SILK 3-0 18XBRD TIE 12 (SUTURE) IMPLANT
SUT V-LOC BARB 180 2/0GR6 GS22 (SUTURE) ×4
SUT VIC AB 2-0 SH 18 (SUTURE) IMPLANT
SUT VIC AB 2-0 SH 27 (SUTURE)
SUT VIC AB 2-0 SH 27X BRD (SUTURE) IMPLANT
SUT VIC AB 3-0 SH 18 (SUTURE) IMPLANT
SUT VIC AB 4-0 PS2 27 (SUTURE) ×4 IMPLANT
SUT VICRYL 0 UR6 27IN ABS (SUTURE) ×2 IMPLANT
SUTURE V-LC BRB 180 2/0GR6GS22 (SUTURE) IMPLANT
SYR 10ML ECCENTRIC (SYRINGE) ×2 IMPLANT
SYS LAPSCP GELPORT 120MM (MISCELLANEOUS)
SYS WOUND ALEXIS 18CM MED (MISCELLANEOUS)
SYSTEM LAPSCP GELPORT 120MM (MISCELLANEOUS) IMPLANT
SYSTEM WOUND ALEXIS 18CM MED (MISCELLANEOUS) IMPLANT
TOWEL OR 17X26 10 PK STRL BLUE (TOWEL DISPOSABLE) IMPLANT
TOWEL OR NON WOVEN STRL DISP B (DISPOSABLE) ×2 IMPLANT
TROCAR ADV FIXATION 5X100MM (TROCAR) ×2 IMPLANT
TUBING CONNECTING 10 (TUBING) ×4 IMPLANT
TUBING INSUFFLATION 10FT LAP (TUBING) ×2 IMPLANT

## 2021-02-17 NOTE — Transfer of Care (Signed)
Immediate Anesthesia Transfer of Care Note  Patient: Gail Lloyd  Procedure(s) Performed: XI ROBOT ASSISTED RIGHT COLECTOMY (Abdomen)  Patient Location: PACU  Anesthesia Type:General  Level of Consciousness: awake, alert  and oriented  Airway & Oxygen Therapy: Patient Spontanous Breathing and Patient connected to face mask oxygen  Post-op Assessment: Report given to RN and Post -op Vital signs reviewed and stable  Post vital signs: Reviewed and stable  Last Vitals:  Vitals Value Taken Time  BP 104/64 02/17/21 1343  Temp    Pulse 64 02/17/21 1345  Resp    SpO2 100 % 02/17/21 1345  Vitals shown include unvalidated device data.  Last Pain:  Vitals:   02/17/21 0927  TempSrc:   PainSc: 0-No pain         Complications: No notable events documented.

## 2021-02-17 NOTE — Interval H&P Note (Signed)
History and Physical Interval Note:  02/17/2021 10:52 AM  Gail Lloyd  has presented today for surgery, with the diagnosis of ceceal mass.  The various methods of treatment have been discussed with the patient and family. After consideration of risks, benefits and other options for treatment, the patient has consented to  Procedure(s): XI ROBOT ASSISTED LAPAROSCOPIC PARTIAL COLECTOMY (N/A) as a surgical intervention.  The patient's history has been reviewed, patient examined, no change in status, stable for surgery.  I have reviewed the patient's chart and labs.  Questions were answered to the patient's satisfaction.     Rosario Adie, MD  [facility]

## 2021-02-17 NOTE — Anesthesia Postprocedure Evaluation (Signed)
Anesthesia Post Note  Patient: Gail Lloyd  Procedure(s) Performed: XI ROBOT ASSISTED RIGHT COLECTOMY (Abdomen)     Patient location during evaluation: PACU Anesthesia Type: General Level of consciousness: awake and alert Pain management: pain level controlled Vital Signs Assessment: post-procedure vital signs reviewed and stable Respiratory status: spontaneous breathing, nonlabored ventilation and respiratory function stable Cardiovascular status: stable and blood pressure returned to baseline Anesthetic complications: no   No notable events documented.  Last Vitals:  Vitals:   02/17/21 1445 02/17/21 1500  BP: 99/60 (!) 104/59  Pulse: 61 67  Resp: 15 16  Temp:    SpO2: 95% 98%    Last Pain:  Vitals:   02/17/21 1500  TempSrc:   PainSc: 0-No pain                 Audry Pili

## 2021-02-17 NOTE — Anesthesia Procedure Notes (Signed)
Procedure Name: Intubation Date/Time: 02/17/2021 11:33 AM Performed by: [place] Kayra Crowell C, CRNA Pre-anesthesia Checklist: Patient identified, Emergency Drugs available, Suction available and Patient being monitored Patient Re-evaluated:Patient Re-evaluated prior to induction Oxygen Delivery Method: Circle system utilized Preoxygenation: Pre-oxygenation with 100% oxygen Induction Type: IV induction Ventilation: Mask ventilation without difficulty Laryngoscope Size: Mac and 3 Grade View: Grade I Tube type: Oral Tube size: 7.0 mm Number of attempts: 1 Airway Equipment and Method: Stylet and Oral airway Placement Confirmation: ETT inserted through vocal cords under direct vision, positive ETCO2 and breath sounds checked- equal and bilateral Secured at: 21 cm Tube secured with: Tape Dental Injury: Teeth and Oropharynx as per pre-operative assessment

## 2021-02-17 NOTE — Anesthesia Preprocedure Evaluation (Addendum)
Anesthesia Evaluation  Patient identified by MRN, date of birth, ID band Patient awake    Reviewed: Allergy & Precautions, NPO status , Patient's Chart, lab work & pertinent test results, reviewed documented beta blocker date and time   History of Anesthesia Complications Negative for: history of anesthetic complications  Airway Mallampati: II  TM Distance: >3 FB Neck ROM: Full    Dental  (+) Dental Advisory Given, Missing   Pulmonary neg pulmonary ROS,    Pulmonary exam normal        Cardiovascular hypertension, Pt. on home beta blockers and Pt. on medications Normal cardiovascular exam     Neuro/Psych negative neurological ROS  negative psych ROS   GI/Hepatic negative GI ROS, Neg liver ROS,   Endo/Other   Pre-DM   Renal/GU negative Renal ROS     Musculoskeletal negative musculoskeletal ROS (+)   Abdominal   Peds  Hematology negative hematology ROS (+)   Anesthesia Other Findings   Reproductive/Obstetrics                            Anesthesia Physical Anesthesia Plan  ASA: 2  Anesthesia Plan: General   Post-op Pain Management: Tylenol PO (pre-op)* and Celebrex PO (pre-op)*   Induction: Intravenous  PONV Risk Score and Plan: 3 and Treatment may vary due to age or medical condition, Ondansetron, Dexamethasone, Midazolam and Scopolamine patch - Pre-op  Airway Management Planned: Oral ETT  Additional Equipment: None  Intra-op Plan:   Post-operative Plan: Extubation in OR  Informed Consent: I have reviewed the patients History and Physical, chart, labs and discussed the procedure including the risks, benefits and alternatives for the proposed anesthesia with the patient or authorized representative who has indicated his/her understanding and acceptance.     Dental advisory given  Plan Discussed with: CRNA and Anesthesiologist  Anesthesia Plan Comments:         Anesthesia Quick Evaluation

## 2021-02-17 NOTE — Op Note (Addendum)
02/17/2021  1:36 PM  PATIENT:  Gail Lloyd  56 y.o. female  Patient Care Team: Billie Ruddy, MD as PCP - General (Family Medicine) Jettie Booze, MD as PCP - Cardiology (Cardiology)  PRE-OPERATIVE DIAGNOSIS:  cecal polyp  POST-OPERATIVE DIAGNOSIS:  cecal polyp  PROCEDURE: XI ROBOT ASSISTED RIGHT COLECTOMY    Surgeon(s): Leighton Ruff, MD Ileana Roup, MD  ASSISTANT: Dr Dema Severin   ANESTHESIA:   local and general  EBL: 50 ml Total I/O In: 1000 [I.V.:1000] Out: 100 [Urine:100]  Delay start of Pharmacological VTE agent (>24hrs) due to surgical blood loss or risk of bleeding:  no  DRAINS: none   SPECIMEN:  Source of Specimen:  R colon  DISPOSITION OF SPECIMEN:  PATHOLOGY  COUNTS:  YES  PLAN OF CARE: Admit to inpatient   PATIENT DISPOSITION:  PACU - hemodynamically stable.  INDICATION:    56 y.o. F with large carpeting polyp in the cecum.  I recommended segmental resection:  The anatomy & physiology of the digestive tract was discussed.  The pathophysiology was discussed.  Natural history risks without surgery was discussed.   I worked to give an overview of the disease and the frequent need to have multispecialty involvement.  I feel the risks of no intervention will lead to serious problems that outweigh the operative risks; therefore, I recommended a partial colectomy to remove the pathology.  Laparoscopic & open techniques were discussed.   Risks such as bleeding, infection, abscess, leak, reoperation, possible ostomy, hernia, heart attack, death, and other risks were discussed.  I noted a good likelihood this will help address the problem.   Goals of post-operative recovery were discussed as well.    The patient expressed understanding & wished to proceed with surgery.  OR FINDINGS:   Patient had normal appearing anatomy with minimal post surgical adhesions.  No obvious metastatic disease on visceral parietal peritoneum or liver.  DESCRIPTION:    Informed consent was confirmed.  The patient underwent general anaesthesia without difficulty.  The patient was positioned appropriately.  VTE prevention in place.  The patient's abdomen was clipped, prepped, & draped in a sterile fashion.  Surgical timeout confirmed our plan.  The patient was positioned in reverse Trendelenburg.  Abdominal entry was gained using a Varies needle in the LUQ.  Entry was clean.  I induced carbon dioxide insufflation.  An 2mm robotic port was placed in the LUQ.  Camera inspection revealed no injury.  Extra ports were carefully placed under direct laparoscopic visualization.  I laparoscopically reflected the greater omentum and the upper abdomen the small bowel in the peilvis. The patient was appropriately positioned and the robot was docked to the patient's right side.  Instruments were placed under direct visualization.    I began by identifying the ileocolic artery and vein within the mesentery. Dissection was bluntly carried around these structures. The duodenum was identified and free from the structures. I then used the robotic vessel sealer device to transect these.  I developed the retroperitoneal plane bluntly.  I then freed the appendix off its attachments to the pelvic wall. I mobilized the terminal ileum.  I took care to avoid injuring any retroperitoneal structures.  After this I began to mobilize laterally down the white line of [place] and then took down the hepatic flexure using the Enseal device. I mobilized the omentum off of the right transverse colon. The entire colon was then flipped medially and mobilized off of the retroperitoneal structures until I could visualize  the lateral edge of the duodenum underneath.  I gently freed the duodenal attachments.   I identified a portion of mesentery of the transverse colon just proximal to the right branch of the middle colic.  I divided up to the colon from the previous dissection of the mesentery using the robotic  vessel sealer.  I then divided the terminal ileal mesentery in similar fashion.  At that point, the terminal ileum was divided with a blue load robotic 60 mm stapler.  The transverse colon was divided with a green load robotic stapler.  The specimen was then completely free and placed in the right upper quadrant.  Hemostasis was good.   I then oriented the remaining terminal ileum and transverse colon and a isoperistaltic fashion.  I placed an enterotomy in the small bowel and colon using the robotic scissors.  I then introduced a white load 60 mm robotic stapler into both enterotomies and created an anastomosis between the small bowel and transverse colon.  Hemostasis within the staple line was good.  The common enterotomy channel was closed using 2 running 2-0 V-Loc sutures.  The abdomen was then irrigated with normal saline. The omentum was then brought down over the anastomosis.  At this point the robot was undocked.  The 12 mm suprapubic port was enlarged to a Pfannenstiel incision and an Ovid wound protector was placed.  The specimen was removed from the abdomen and evaluated.  Once the abdomen was inspected for hemostasis, the [place] wound protector was removed.    The peritoneum of the Pfannenstiel incision was closed using a running 0 Vicryl suture.  The fascia was then closed using 2 running #1 Novafil sutures.  The subcutaneous tissue of the extraction incision was closed using a running 2-0 Vicryl suture. The skin was then closed using running subcuticular 4-0 Vicryl sutures.  A sterile dressing was applied.  The remaining port sites were closed using interrupted 4-0 Vicryl sutures and Dermabond.  All counts were correct per operating room staff. The patient was then awakened from anesthesia and sent to the post anesthesia care unit in stable condition.     Rosario Adie, MD  [facility] [place] [facility]

## 2021-02-18 ENCOUNTER — Other Ambulatory Visit: Payer: Self-pay

## 2021-02-18 LAB — BASIC METABOLIC PANEL
Anion gap: 6 (ref 5–15)
BUN: 11 mg/dL (ref 6–20)
CO2: 24 mmol/L (ref 22–32)
Calcium: 8.9 mg/dL (ref 8.9–10.3)
Chloride: 106 mmol/L (ref 98–111)
Creatinine, Ser: 1.1 mg/dL — ABNORMAL HIGH (ref 0.44–1.00)
GFR, Estimated: 59 mL/min — ABNORMAL LOW (ref >60)
Glucose, Bld: 157 mg/dL — ABNORMAL HIGH (ref 70–99)
Potassium: 3.4 mmol/L — ABNORMAL LOW (ref 3.5–5.1)
Sodium: 136 mmol/L (ref 135–145)

## 2021-02-18 LAB — CBC
HCT: 33.1 % — ABNORMAL LOW (ref 36.0–46.0)
Hemoglobin: 10.9 g/dL — ABNORMAL LOW (ref 12.0–15.0)
MCH: 29.5 pg (ref 26.0–34.0)
MCHC: 32.9 g/dL (ref 30.0–36.0)
MCV: 89.7 fL (ref 80.0–100.0)
Platelets: 245 10*3/uL (ref 150–400)
RBC: 3.69 MIL/uL — ABNORMAL LOW (ref 3.87–5.11)
RDW: 13 % (ref 11.5–15.5)
WBC: 13.9 10*3/uL — ABNORMAL HIGH (ref 4.0–10.5)
nRBC: 0 % (ref 0.0–0.2)

## 2021-02-18 NOTE — Progress Notes (Signed)
1 Day Post-Op Robotic R colectomy.  Subjective:  No acute issues, passing flatus  Objective: Vital signs in last 24 hours: Temp:  [97.8 F (36.6 C)-98.9 F (37.2 C)] 98.6 F (37 C) (02/16 0501) Pulse Rate:  [61-89] 70 (02/16 0501) Resp:  [15-18] 18 (02/16 0501) BP: (94-124)/(57-73) 99/60 (02/16 0501) SpO2:  [94 %-100 %] 95 % (02/16 0501) Weight:  [83.9 kg-84 kg] 84 kg (02/16 0500)   Intake/Output from previous day: 02/15 0701 - 02/16 0700 In: 2737.9 [P.O.:320; I.V.:2217.9; IV Piggyback:200] Out: 2450 [Urine:2400; Blood:50] Intake/Output this shift: No intake/output data recorded.   General appearance: alert and cooperative GI: soft, non-distended  Incision: no significant drainage  Lab Results:  Recent Labs    02/18/21 0357  WBC 13.9*  HGB 10.9*  HCT 33.1*  PLT 245   BMET Recent Labs    02/18/21 0357  NA 136  K 3.4*  CL 106  CO2 24  GLUCOSE 157*  BUN 11  CREATININE 1.10*  CALCIUM 8.9   PT/INR No results for input(s): LABPROT, INR in the last 72 hours. ABG No results for input(s): PHART, HCO3 in the last 72 hours.  Invalid input(s): PCO2, PO2  MEDS, Scheduled  acetaminophen  1,000 mg Oral Q6H   alvimopan  12 mg Oral BID   amLODipine  5 mg Oral Daily   carvedilol  6.25 mg Oral BID WC   enoxaparin (LOVENOX) injection  40 mg Subcutaneous Q24H   feeding supplement  237 mL Oral BID BM   gabapentin  300 mg Oral BID   losartan  50 mg Oral Daily   saccharomyces boulardii  250 mg Oral BID    Studies/Results: No results found.  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

## 2021-02-18 NOTE — Progress Notes (Signed)
Transition of Care [facility]) Screening Note  Patient Details  Name: Gail Lloyd Date of Birth: April 28, 1965  Transition of Care [facility]) CM/SW Contact:    Sherie Don, LCSW Phone Number: 02/18/2021, 10:30 AM  Transition of Care Department [facility]) has reviewed patient and no TOC needs have been identified at this time. We will continue to monitor patient advancement through interdisciplinary progression rounds. If new patient transition needs arise, please place a TOC consult.

## 2021-02-19 ENCOUNTER — Encounter: Payer: Self-pay | Admitting: General Surgery

## 2021-02-19 LAB — CBC
HCT: 31.4 % — ABNORMAL LOW (ref 36.0–46.0)
Hemoglobin: 10.3 g/dL — ABNORMAL LOW (ref 12.0–15.0)
MCH: 30 pg (ref 26.0–34.0)
MCHC: 32.8 g/dL (ref 30.0–36.0)
MCV: 91.5 fL (ref 80.0–100.0)
Platelets: 223 10*3/uL (ref 150–400)
RBC: 3.43 MIL/uL — ABNORMAL LOW (ref 3.87–5.11)
RDW: 13.6 % (ref 11.5–15.5)
WBC: 9.4 10*3/uL (ref 4.0–10.5)
nRBC: 0 % (ref 0.0–0.2)

## 2021-02-19 LAB — BASIC METABOLIC PANEL
Anion gap: 7 (ref 5–15)
BUN: 15 mg/dL (ref 6–20)
CO2: 26 mmol/L (ref 22–32)
Calcium: 8.5 mg/dL — ABNORMAL LOW (ref 8.9–10.3)
Chloride: 107 mmol/L (ref 98–111)
Creatinine, Ser: 0.89 mg/dL (ref 0.44–1.00)
GFR, Estimated: >60 mL/min (ref >60)
Glucose, Bld: 103 mg/dL — ABNORMAL HIGH (ref 70–99)
Potassium: 3.1 mmol/L — ABNORMAL LOW (ref 3.5–5.1)
Sodium: 140 mmol/L (ref 135–145)

## 2021-02-19 LAB — SURGICAL PATHOLOGY

## 2021-02-19 MED ORDER — TRAMADOL HCL 50 MG PO TABS: 50.0000 mg | ORAL_TABLET | Freq: Four times a day (QID) | ORAL | 0 refills | Status: DC | PRN

## 2021-02-19 NOTE — Discharge Summary (Signed)
Physician Discharge Summary  Patient ID: Gail Lloyd MRN: [id number] DOB/AGE: 1965/11/21 56 y.o.  Admit date: 02/17/2021 Discharge date: 02/19/2021  Admission Diagnoses:  Discharge Diagnoses:  Principal Problem:   Colon polyp   Discharged Condition: good  Hospital Course: Patient was admitted to the med surg floor after surgery.  Diet was advanced as tolerated.  Patient began to have bowel function on postop day 1.  By postop day 2, she was tolerating a solid diet and pain was controlled with oral medications.  She was urinating without difficulty and ambulating without assistance.  Patient was felt to be in stable condition for discharge to home.   Consults: None  Significant Diagnostic Studies: labs: cbc, bmet  Treatments: IV hydration and analgesia: acetaminophen  Discharge Exam: Blood pressure 124/68, pulse 77, temperature 98.5 F (36.9 C), temperature source Oral, resp. rate 16, height 5\' 7"  (1.702 m), weight 84.5 kg, last menstrual period 01/31/2017, SpO2 99 %. General appearance: alert and cooperative GI: normal findings: soft, non-tender Incision: clean, dry, intact  Disposition: Discharge disposition: 01-Home or Self Care      Allergies as of 02/19/2021       Reactions   Penicillins Hives, Itching   Tolerated cephalosporin (02/17/21)   Sudafed [pseudoephedrine Hcl] Palpitations        Medication List     TAKE these medications    amLODipine 5 MG tablet Commonly known as: NORVASC TAKE 1 TABLET(5 MG) BY MOUTH DAILY   carvedilol 6.25 MG tablet Commonly known as: COREG Take 1 tablet (6.25 mg total) by mouth 2 (two) times daily with a meal.   diphenhydrAMINE 25 MG tablet Commonly known as: BENADRYL Take 25 mg by mouth every 6 (six) hours as needed for allergies.   Emergen-C Immune Plus/Vit D Chew Chew 1 each by mouth daily.   fluticasone 50 MCG/ACT nasal spray Commonly known as: FLONASE SHAKE LIQUID AND USE 1 SPRAY IN EACH NOSTRIL DAILY   Garlic  1324 MG Caps Take 1,000 mg by mouth daily.   ipratropium 0.06 % nasal spray Commonly known as: Atrovent Place 2 sprays into both nostrils 4 (four) times daily. What changed:  when to take this reasons to take this   losartan 50 MG tablet Commonly known as: COZAAR Take 1 tablet (50 mg total) by mouth daily.   magnesium gluconate 500 MG tablet Commonly known as: MAGONATE Take 500 mg by mouth daily.   multivitamin with minerals Tabs tablet Take 1 tablet by mouth daily.   rosuvastatin 10 MG tablet Commonly known as: CRESTOR TAKE 1 TABLET(10 MG) BY MOUTH DAILY   traMADol 50 MG tablet Commonly known as: ULTRAM Take 1-2 tablets (50-100 mg total) by mouth every 6 (six) hours as needed for moderate pain.        Follow-up Information     Leighton Ruff, MD. Schedule an appointment as soon as possible for a visit in 2 week(s).   Specialties: General Surgery, Colon and Rectal Surgery Contact information: [address] [place] [address] [phone]                 Signed: Rosario Adie 4/74/2595, 12:14 PM

## 2021-02-19 NOTE — Discharge Instructions (Signed)
SURGERY: POST OP INSTRUCTIONS (Surgery for small bowel obstruction, colon resection, etc)   ######################################################################  EAT Gradually transition to a high fiber diet with a fiber supplement over the next few days after discharge  WALK Walk an hour a day.  Control your pain to do that.    CONTROL PAIN Control pain so that you can walk, sleep, tolerate sneezing/coughing, go up/down stairs.  HAVE A BOWEL MOVEMENT DAILY Keep your bowels regular to avoid problems.  OK to try a laxative to override constipation.  OK to use an antidairrheal to slow down diarrhea.  Call if not better after 2 tries  CALL IF YOU HAVE PROBLEMS/CONCERNS Call if you are still struggling despite following these instructions. Call if you have concerns not answered by these instructions  ######################################################################   DIET Follow a light diet the first few days at home.  Start with a bland diet such as soups, liquids, starchy foods, low fat foods, etc.  If you feel full, bloated, or constipated, stay on a ful liquid or pureed/blenderized diet for a few days until you feel better and no longer constipated. Be sure to drink plenty of fluids every day to avoid getting dehydrated (feeling dizzy, not urinating, etc.). Gradually add a fiber supplement to your diet over the next week.  Gradually get back to a regular solid diet.  Avoid fast food or heavy meals the first week as you are more likely to get nauseated. It is expected for your digestive tract to need a few months to get back to normal.  It is common for your bowel movements and stools to be irregular.  You will have occasional bloating and cramping that should eventually fade away.  Until you are eating solid food normally, off all pain medications, and back to regular activities; your bowels will not be normal. Focus on eating a low-fat, high fiber diet the rest of your life  (See Getting to Good Bowel Health, below).  CARE of your INCISION or WOUND  It is good for closed incisions and even open wounds to be washed every day.  Shower every day.  Short baths are fine.  Wash the incisions and wounds clean with soap & water.    You may leave closed incisions open to air if it is dry.   You may cover the incision with clean gauze & replace it after your daily shower for comfort.  STAPLES: You have skin staples.  Leave them in place & set up an appointment for them to be removed by a surgery office nurse ~10 days after surgery. = 1st week of January 2024    ACTIVITIES as tolerated Start light daily activities --- self-care, walking, climbing stairs-- beginning the day after surgery.  Gradually increase activities as tolerated.  Control your pain to be active.  Stop when you are tired.  Ideally, walk several times a day, eventually an hour a day.   Most people are back to most day-to-day activities in a few weeks.  It takes 4-8 weeks to get back to unrestricted, intense activity. If you can walk 30 minutes without difficulty, it is safe to try more intense activity such as jogging, treadmill, bicycling, low-impact aerobics, swimming, etc. Save the most intensive and strenuous activity for last (Usually 4-8 weeks after surgery) such as sit-ups, heavy lifting, contact sports, etc.  Refrain from any intense heavy lifting or straining until you are off narcotics for pain control.  You will have off days, but things should improve   week-by-week. DO NOT PUSH THROUGH PAIN.  Let pain be your guide: If it hurts to do something, don't do it.  Pain is your body warning you to avoid that activity for another week until the pain goes down. You may drive when you are no longer taking narcotic prescription pain medication, you can comfortably wear a seatbelt, and you can safely make sudden turns/stops to protect yourself without hesitating due to pain. You may have sexual intercourse when it  is comfortable. If it hurts to do something, stop.  MEDICATIONS Take your usually prescribed home medications unless otherwise directed.   Blood thinners:  Usually you can restart any strong blood thinners after the second postoperative day.  It is OK to take aspirin right away.     If you are on strong blood thinners (warfarin/Coumadin, Plavix, Xerelto, Eliquis, Pradaxa, etc), discuss with your surgeon, medicine PCP, and/or cardiologist for instructions on when to restart the blood thinner & if blood monitoring is needed (PT/INR blood check, etc).     PAIN CONTROL Pain after surgery or related to activity is often due to strain/injury to muscle, tendon, nerves and/or incisions.  This pain is usually short-term and will improve in a few months.  To help speed the process of healing and to get back to regular activity more quickly, DO THE FOLLOWING THINGS TOGETHER: Increase activity gradually.  DO NOT PUSH THROUGH PAIN Use Ice and/or Heat Try Gentle Massage and/or Stretching Take over the counter pain medication Take Narcotic prescription pain medication for more severe pain  Good pain control = faster recovery.  It is better to take more medicine to be more active than to stay in bed all day to avoid medications.  Increase activity gradually Avoid heavy lifting at first, then increase to lifting as tolerated over the next 6 weeks. Do not "push through" the pain.  Listen to your body and avoid positions and maneuvers than reproduce the pain.  Wait a few days before trying something more intense Walking an hour a day is encouraged to help your body recover faster and more safely.  Start slowly and stop when getting sore.  If you can walk 30 minutes without stopping or pain, you can try more intense activity (running, jogging, aerobics, cycling, swimming, treadmill, sex, sports, weightlifting, etc.) Remember: If it hurts to do it, then don't do it! Use Ice and/or Heat You will have swelling and  bruising around the incisions.  This will take several weeks to resolve. Ice packs or heating pads (6-8 times a day, 30-60 minutes at a time) will help sooth soreness & bruising. Some people prefer to use ice alone, heat alone, or alternate between ice & heat.  Experiment and see what works best for you.  Consider trying ice for the first few days to help decrease swelling and bruising; then, switch to heat to help relax sore spots and speed recovery. Shower every day.  Short baths are fine.  It feels good!  Keep the incisions and wounds clean with soap & water.   Try Gentle Massage and/or Stretching Massage at the area of pain many times a day Stop if you feel pain - do not overdo it Take over the counter pain medication This helps the muscle and nerve tissues become less irritable and calm down faster Choose ONE of the following over-the-counter anti-inflammatory medications: Acetaminophen 500mg tabs (Tylenol) 1-2 pills with every meal and just before bedtime (avoid if you have liver problems or if you have   acetaminophen in you narcotic prescription) Naproxen 220mg tabs (ex. Aleve, Naprosyn) 1-2 pills twice a day (avoid if you have kidney, stomach, IBD, or bleeding problems) Ibuprofen 200mg tabs (ex. Advil, Motrin) 3-4 pills with every meal and just before bedtime (avoid if you have kidney, stomach, IBD, or bleeding problems) Take with food/snack several times a day as directed for at least 2 weeks to help keep pain / soreness down & more manageable. Take Narcotic prescription pain medication for more severe pain A prescription for strong pain control is often given to you upon discharge (for example: oxycodone/Percocet, hydrocodone/Norco/Vicodin, or tramadol/Ultram) Take your pain medication as prescribed. Be mindful that most narcotic prescriptions contain Tylenol (acetaminophen) as well - avoid taking too much Tylenol. If you are having problems/concerns with the prescription medicine (does  not control pain, nausea, vomiting, rash, itching, etc.), please call us [phone] to see if we need to switch you to a different pain medicine that will work better for you and/or control your side effects better. If you need a refill on your pain medication, you must call the office before 4 pm and on weekdays only.  By federal law, prescriptions for narcotics cannot be called into a pharmacy.  They must be filled out on paper & picked up from our office by the patient or authorized caretaker.  Prescriptions cannot be filled after 4 pm nor on weekends.    WHEN TO CALL US [phone] Severe uncontrolled or worsening pain  Fever over 101 F (38.5 C) Concerns with the incision: Worsening pain, redness, rash/hives, swelling, bleeding, or drainage Reactions / problems with new medications (itching, rash, hives, nausea, etc.) Nausea and/or vomiting Difficulty urinating Difficulty breathing Worsening fatigue, dizziness, lightheadedness, blurred vision Other concerns If you are not getting better after two weeks or are noticing you are getting worse, contact our office [phone] for further advice.  We may need to adjust your medications, re-evaluate you in the office, send you to the emergency room, or see what other things we can do to help. The clinic staff is available to answer your questions during regular business hours (8:30am-5pm).  Please don't hesitate to call and ask to speak to one of our nurses for clinical concerns.    A surgeon from [facility] is always on call at the hospitals 24 hours/day If you have a medical emergency, go to the nearest emergency room or call 911.  FOLLOW UP in our office One the day of your discharge from the hospital (or the next business weekday), please call [facility] to set up or confirm an appointment to see your surgeon in the office for a follow-up appointment.  Usually it is 2-3 weeks after your surgery.   If you  have skin staples at your incision(s), let the office know so we can set up a time in the office for the nurse to remove them (usually around 10 days after surgery). Make sure that you call for appointments the day of discharge (or the next business weekday) from the hospital to ensure a convenient appointment time. IF YOU HAVE DISABILITY OR FAMILY LEAVE FORMS, BRING THEM TO THE OFFICE FOR PROCESSING.  DO NOT GIVE THEM TO YOUR DOCTOR.  [facility] [address], [address], [place], [place]  [address] ? [phone] - Main [phone] - Toll Free,  [phone] - Fax [email or web address]    GETTING TO GOOD BOWEL HEALTH. It is expected for your digestive tract to   need a few months to get back to normal.  It is common for your bowel movements and stools to be irregular.  You will have occasional bloating and cramping that should eventually fade away.  Until you are eating solid food normally, off all pain medications, and back to regular activities; your bowels will not be normal.   Avoiding constipation The goal: ONE SOFT BOWEL MOVEMENT A DAY!    Drink plenty of fluids.  Choose water first. TAKE A FIBER SUPPLEMENT EVERY DAY THE REST OF YOUR LIFE During your first week back home, gradually add back a fiber supplement every day Experiment which form you can tolerate.   There are many forms such as powders, tablets, wafers, gummies, etc Psyllium bran (Metamucil), methylcellulose (Citrucel), Miralax or Glycolax, Benefiber, Flax Seed.  Adjust the dose week-by-week (1/2 dose/day to 6 doses a day) until you are moving your bowels 1-2 times a day.  Cut back the dose or try a different fiber product if it is giving you problems such as diarrhea or bloating. Sometimes a laxative is needed to help jump-start bowels if constipated until the fiber supplement can help regulate your bowels.  If you are tolerating eating & you are farting, it is okay to try a gentle  laxative such as double dose MiraLax, prune juice, or Milk of Magnesia.  Avoid using laxatives too often. Stool softeners can sometimes help counteract the constipating effects of narcotic pain medicines.  It can also cause diarrhea, so avoid using for too long. If you are still constipated despite taking fiber daily, eating solids, and a few doses of laxatives, call our office. Controlling diarrhea Try drinking liquids and eating bland foods for a few days to avoid stressing your intestines further. Avoid dairy products (especially milk & ice cream) for a short time.  The intestines often can lose the ability to digest lactose when stressed. Avoid foods that cause gassiness or bloating.  Typical foods include beans and other legumes, cabbage, broccoli, and dairy foods.  Avoid greasy, spicy, fast foods.  Every person has some sensitivity to other foods, so listen to your body and avoid those foods that trigger problems for you. Probiotics (such as active yogurt, Align, etc) may help repopulate the intestines and colon with normal bacteria and calm down a sensitive digestive tract Adding a fiber supplement gradually can help thicken stools by absorbing excess fluid and retrain the intestines to act more normally.  Slowly increase the dose over a few weeks.  Too much fiber too soon can backfire and cause cramping & bloating. It is okay to try and slow down diarrhea with a few doses of antidiarrheal medicines.   Bismuth subsalicylate (ex. Kayopectate, Pepto Bismol) for a few doses can help control diarrhea.  Avoid if pregnant.   Loperamide (Imodium) can slow down diarrhea.  Start with one tablet (2mg) first.  Avoid if you are having fevers or severe pain.  ILEOSTOMY PATIENTS WILL HAVE CHRONIC DIARRHEA since their colon is not in use.    Drink plenty of liquids.  You will need to drink even more glasses of water/liquid a day to avoid getting dehydrated. Record output from your ileostomy.  Expect to empty  the bag every 3-4 hours at first.  Most people with a permanent ileostomy empty their bag 4-6 times at the least.   Use antidiarrheal medicine (especially Imodium) several times a day to avoid getting dehydrated.  Start with a dose at bedtime & breakfast.  Adjust up or   down as needed.  Increase antidiarrheal medications as directed to avoid emptying the bag more than 8 times a day (every 3 hours). Work with your wound ostomy nurse to learn care for your ostomy.  See ostomy care instructions. TROUBLESHOOTING IRREGULAR BOWELS 1) Start with a soft & bland diet. No spicy, greasy, or fried foods.  2) Avoid gluten/wheat or dairy products from diet to see if symptoms improve. 3) Miralax 17gm or flax seed mixed in 8oz. water or juice-daily. May use 2-4 times a day as needed. 4) Gas-X, Phazyme, etc. as needed for gas & bloating.  5) Prilosec (omeprazole) over-the-counter as needed 6)  Consider probiotics (Align, Activa, etc) to help calm the bowels down  Call your doctor if you are getting worse or not getting better.  Sometimes further testing (cultures, endoscopy, X-ray studies, CT scans, bloodwork, etc.) may be needed to help diagnose and treat the cause of the diarrhea. [facility] [address], [address], [place], [place]  [address] [phone] - Main.    [phone]  - Toll Free.   [phone] - Fax [email or web address]   ###############################   #######################################################  Ostomy Support Information  You've heard that people get along just fine with only one of their eyes, or one of their lungs, or one of their kidneys. But you also know that you have only one intestine and only one bladder, and that leaves you feeling awfully empty, both physically and emotionally: You think no other people go around without part of their intestine with the ends of their intestines sticking out through their abdominal walls.    YOU ARE NOT ALONE.  There are nearly three quarters of a million people in the [place] who have an ostomy; people who have had surgery to remove all or part of their colons or bladders.   There is even a national association, the [employer] with over 350 local affiliated support groups that are organized by volunteers who provide peer support and counseling. [employer] has a toll free telephone num-ber, 800-826-0826 and an educational, interactive website, www.ostomy.org   An ostomy is an opening in the belly (abdominal wall) made by surgery. Ostomates are people who have had this procedure. The opening (stoma) allows the kidney or bowel to grdischarge waste. An external pouch covers the stoma to collect waste. Pouches are are a simple bag and are odor free. Different companies have disposable or reusable pouches to fit one's lifestyle. An ostomy can either be temporary or permanent.   THERE ARE THREE MAIN TYPES OF OSTOMIES Colostomy. A colostomy is a surgically created opening in the large intestine (colon). Ileostomy. An ileostomy is a surgically created opening in the small intestine. Urostomy. A urostomy is a surgically created opening to divert urine away from the bladder.  OSTOMY Care  The following guidelines will make care of your colostomy easier. Keep this information close by for quick reference.  Helpful DIET hints Eat a well-balanced diet including vegetables and fresh fruits. Eat on a regular schedule.  Drink at least 6 to 8 glasses of fluids daily. Eat slowly in a relaxed atmosphere. Chew your food thoroughly. Avoid chewing gum, smoking, and drinking from a straw. This will help decrease the amount of air you swallow, which may help reduce gas. Eating yogurt or drinking buttermilk may help reduce gas.  To control gas at night, do not eat after 8 p.m. This will give your bowel time to quiet down before you go   to bed.  If gas is a problem, you can purchase  Beano. Sprinkle Beano on the first bite of food before eating to reduce gas. It has no flavor and should not change the taste of your food. You can buy Beano over the counter at your local drugstore.  Foods like fish, onions, garlic, broccoli, asparagus, and cabbage produce odor. Although your pouch is odor-proof, if you eat these foods you may notice a stronger odor when emptying your pouch. If this is a concern, you may want to limit these foods in your diet.  If you have an ileostomy, you will have chronic diarrhea & need to drink more liquids to avoid getting dehydrated.  Consider antidiarrheal medicine like imodium (loperamide) or Lomotil to help slow down bowel movements / diarrhea into your ileostomy bag.  GETTING TO GOOD BOWEL HEALTH WITH AN ILEOSTOMY    With the colon bypassed & not in use, you will have small bowel diarrhea.   It is important to thicken & slow your bowel movements down.   The goal: 4-6 small BOWEL MOVEMENTS A DAY It is important to drink plenty of liquids to avoid getting dehydrated  CONTROLLING ILEOSTOMY DIARRHEA  TAKE A FIBER SUPPLEMENT (FiberCon or Benefiner soluble fiber) twice a day - to thicken stools by absorbing excess fluid and retrain the intestines to act more normally.  Slowly increase the dose over a few weeks.  Too much fiber too soon can backfire and cause cramping & bloating.  TAKE AN IRON SUPPLEMENT twice a day to naturally constipate your bowels.  Usually ferrous sulfate 325mg twice a day)  TAKE ANTI-DIARRHEAL MEDICINES: Loperamide (Imodium) can slow down diarrhea.  Start with two tablets (= 4mg) first and then try one tablet every 6 hours.  Can go up to 2 pills four times day (8 pills of 2mg max) Avoid if you are having fevers or severe pain.  If you are not better or start feeling worse, stop all medicines and call your doctor for advice LoMotil (Diphenoxylate / Atropine) is another medicine that can constipate & slow down bowel moevements Pepto  Bismol (bismuth) can gently thicken bowels as well  If diarrhea is worse,: drink plenty of liquids and try simpler foods for a few days to avoid stressing your intestines further. Avoid dairy products (especially milk & ice cream) for a short time.  The intestines often can lose the ability to digest lactose when stressed. Avoid foods that cause gassiness or bloating.  Typical foods include beans and other legumes, cabbage, broccoli, and dairy foods.  Every person has some sensitivity to other foods, so listen to our body and avoid those foods that trigger problems for you.Call your doctor if you are getting worse or not better.  Sometimes further testing (cultures, endoscopy, X-ray studies, bloodwork, etc) may be needed to help diagnose and treat the cause of the diarrhea. Take extra anti-diarrheal medicines (maximum is 8 pills of 2mg loperamide a day)   Tips for POUCHING an OSTOMY   Changing Your Pouch The best time to change your pouch is in the morning, before eating or drinking anything. Your stoma can function at any time, but it will function more after eating or drinking.   Applying the pouching system  Place all your equipment close at hand before removing your pouch.  Wash your hands.  Stand or sit in front of a mirror. Use the position that works best for you. Remember that you must keep the skin around the stoma   wrinkle-free for a good seal.  Gently remove the used pouch (1-piece system) or the pouch and old wafer (2-piece system). Empty the pouch into the toilet. Save the closure clip to use again.  Wash the stoma itself and the skin around the stoma. Your stoma may bleed a little when being washed. This is normal. Rinse and pat dry. You may use a wash cloth or soft paper towels (like Bounty), mild soap (like Dial, Safeguard, or Ivory), and water. Avoid soaps that contain perfumes or lotions.  For a new pouch (1-piece system) or a new wafer (2-piece system), measure your  stoma using the stoma guide in each box of supplies.  Trace the shape of your stoma onto the back of the new pouch or the back of the new wafer. Cut out the opening. Remove the paper backing and set it aside.  Optional: Apply a skin barrier powder to surrounding skin if it is irritated (bare or weeping), and dust off the excess. Optional: Apply a skin-prep wipe (such as Skin Prep or All-Kare) to the skin around the stoma, and let it dry. Do not apply this solution if the skin is irritated (red, tender, or broken) or if you have shaved around the stoma. Optional: Apply a skin barrier paste (such as Stomahesive, Coloplast, or Premium) around the opening cut in the back of the pouch or wafer. Allow it to dry for 30 to 60 seconds.  Hold the pouch (1-piece system) or wafer (2-piece system) with the sticky side toward your body. Make sure the skin around the stoma is wrinkle-free. Center the opening on the stoma, then press firmly to your abdomen (Fig. 4). Look in the mirror to check if you are placing the pouch, or wafer, in the right position. For a 2-piece system, snap the pouch onto the wafer. Make sure it snaps into place securely.  Place your hand over the stoma and the pouch or wafer for about 30 seconds. The heat from your hand can help the pouch or wafer stick to your skin.  Add deodorant (such as Super Banish or Nullo) to your pouch. Other options include food extracts such as vanilla oil and peppermint extract. Add about 10 drops of the deodorant to the pouch. Then apply the closure clamp. Note: Do not use toxic  chemicals or commercial cleaning agents in your pouch. These substances may harm the stoma.  Optional: For extra seal, apply tape to all 4 sides around the pouch or wafer, as if you were framing a picture. You may use any brand of medical adhesive tape. Change your pouch every 5 to 7 days. Change it immediately if a leak occurs.  Wash your hands afterwards.  If you are wearing a  2-piece system, you may use 2 new pouches per week and alternate them. Rinse the pouch with mild soap and warm water and hang it to dry for the next day. Apply the fresh pouch. Alternate the 2 pouches like this for a week. After a week, change the wafer and begin with 2 new pouches. Place the old pouches in a plastic bag, and put them in the trash.   LIVING WITH AN OSTOMY  Emptying Your Pouch Empty your pouch when it is one-third full (of urine, stool, and/or gas). If you wait until your pouch is fuller than this, it will be more difficult to empty and more noticeable. When you empty your pouch, either put toilet paper in the toilet bowl first, or flush the   toilet while you empty the pouch. This will reduce splashing. You can empty the pouch between your legs or to one side while sitting, or while standing or stooping. If you have a 2-piece system, you can snap off the pouch to empty it. Remember that your stoma may function during this time. If you wish to rinse your pouch after you empty it, a turkey baster can be helpful. When using a baster, squirt water up into the pouch through the opening at the bottom. With a 2-piece system, you can snap off the pouch to rinse it. After rinsing  your pouch, empty it into the toilet. When rinsing your pouch at home, put a few granules of Dreft soap in the rinse water. This helps lubricate and freshen your pouch. The inside of your pouch can be sprayed with non-stick cooking oil (Pam spray). This may help reduce stool sticking to the inside of the pouch.  Bathing You may shower or bathe with your pouch on or off. Remember that your stoma may function during this time.  The materials you use to wash your stoma and the skin around it should be clean, but they do not need to be sterile.  Wearing Your Pouch During hot weather, or if you perspire a lot in general, wear a cover over your pouch. This may prevent a rash on your skin under the pouch. Pouch covers are  sold at ostomy supply stores. Wear the pouch inside your underwear for better support. Watch your weight. Any gain or loss of 10 to 15 pounds or more can change the way your pouch fits.  Going Away From Home A collapsible cup (like those that come in travel kits) or a soft plastic squirt bottle with a pull-up top (like a travel bottle for shampoo) can be used for rinsing your pouch when you are away from home. Tilt the opening of the pouch at an upward angle when using a cup to rinse.  Carry wet wipes or extra tissues to use in public bathrooms.  Carry an extra pouching system with you at all times.  Never keep ostomy supplies in the glove compartment of your car. Extreme heat or cold can damage the skin barriers and adhesive wafers on the pouch.  When you travel, carry your ostomy supplies with you at all times. Keep them within easy reach. Do not pack ostomy supplies in baggage that will be checked or otherwise separated from you, because your baggage might be lost. If you're traveling out of the country, it is helpful to have a letter stating that you are carrying ostomy supplies as a medical necessity.  If you need ostomy supplies while traveling, look in the yellow pages of the telephone book under "Surgical Supplies." Or call the local ostomy organization to find out where supplies are available.  Do not let your ostomy supplies get low. Always order new pouches before you use the last one.  Reducing Odor Limit foods such as broccoli, cabbage, onions, fish, and garlic in your diet to help reduce odor. Each time you empty your pouch, carefully clean the opening of the pouch, both inside and outside, with toilet paper. Rinse your pouch 1 or 2 times daily after you empty it (see directions for emptying your pouch and going away from home). Add deodorant (such as Super Banish or Nullo) to your pouch. Use air deodorizers in your bathroom. Do not add aspirin to your pouch. Even though  aspirin can help prevent odor, it   could cause ulcers on your stoma.  When to call the doctor Call the doctor if you have any of the following symptoms: Purple, black, or white stoma Severe cramps lasting more than 6 hours Severe watery discharge from the stoma lasting more than 6 hours No output from the colostomy for 3 days Excessive bleeding from your stoma Swelling of your stoma to more than 1/2-inch larger than usual Pulling inward of your stoma below skin level Severe skin irritation or deep ulcers Bulging or other changes in your abdomen  When to call your ostomy nurse Call your ostomy/enterostomal therapy (WOCN) nurse if any of the following occurs: Frequent leaking of your pouching system Change in size or appearance of your stoma, causing discomfort or problems with your pouch Skin rash or rawness Weight gain or loss that causes problems with your pouch     FREQUENTLY ASKED QUESTIONS   Why haven't you met any of these folks who have an ostomy?  Well, maybe you have! You just did not recognize them because an ostomy doesn't show. It can be kept secret if you wish. Why, maybe some of your best friends, office associates or neighbors have an ostomy ... you never can tell. People facing ostomy surgery have many quality-of-life questions like: Will you bulge? Smell? Make noises? Will you feel waste leaving your body? Will you be a captive of the toilet? Will you starve? Be a social outcast? Get/stay married? Have babies? Easily bathe, go swimming, bend over?  OK, let's look at what you can expect:   Will you bulge?  Remember, without part of the intestine or bladder, and its contents, you should have a flatter tummy than before. You can expect to wear, with little exception, what you wore before surgery ... and this in-cludes tight clothing and bathing suits.   Will you smell?  Today, thanks to modern odor proof pouching systems, you can walk into an ostomy support group  meeting and not smell anything that is foul or offensive. And, for those with an ileostomy or colostomy who are concerned about odor when emptying their pouch, there are in-pouch deodorants that can be used to eliminate any waste odors that may exist.   Will you make noises?  Everyone produces gas, especially if they are an air-swallower. But intestinal sounds that occur from time to time are no differ-ent than a gurgling tummy, and quite often your clothing will muffle any sounds.   Will you feel the waste discharges?  For those with a colostomy or ileostomy there might be a slight pressure when waste leaves your body, but understand that the intestines have no nerve endings, so there will be no unpleasant sensations. Those with a urostomy will probably be unaware of any kidney drainage.   Will you be a captive of the toilet?  Immediately post-op you will spend more time in the bathroom than you will after your body recovers from surgery. Every person is different, but on average those with an ileostomy or urostomy may empty their pouches 4 to 6 times a day; a little  less if you have a colostomy. The average wear time between pouch system changes is 3 to 5 days and the changing process should take less than 30 minutes.   Will I need to be on a special diet? Most people return to their normal diet when they have recovered from surgery. Be sure to chew your food well, eat a well-balanced diet and drink plenty of fluids. If   you experience problems with a certain food, wait a couple of weeks and try it again.  Will there be odor and noises? Pouching systems are designed to be odor-proof or odor-resistant. There are deodorants that can be used in the pouch. Medications are also available to help reduce odor. Limit gas-producing foods and carbonated beverages. You will experience less gas and fewer noises as you heal from surgery.  How much time will it take to care for my ostomy? At first, you may  spend a lot of time learning about your ostomy and how to take care of it. As you become more comfortable and skilled at changing the pouching system, it will take very little time to care for it.   Will I be able to return to work? People with ostomies can perform most jobs. As soon as you have healed from surgery, you should be able to return to work. Heavy lifting (more than 10 pounds) may be discouraged.   What about intimacy? Sexual relationships and intimacy are important and fulfilling aspects of your life. They should continue after ostomy surgery. Intimacy-related concerns should be discussed openly between you and your partner.   Can I wear regular clothing? You do not need to wear special clothing. Ostomy pouches are fairly flat and barely noticeable. Elastic undergarments will not hurt the stoma or prevent the ostomy from functioning.   Can I participate in sports? An ostomy should not limit your involvement in sports. Many people with ostomies are runners, skiers, swimmers or participate in other active lifestyles. Talk with your caregiver first before doing heavy physical activity.  Will you starve?  Not if you follow doctor's orders at each stage of your post-op adjustment. There is no such thing as an "ostomy diet". Some people with an ostomy will be able to eat and tolerate anything; others may find diffi-culty with some foods. Each person is an individual and must determine, by trial, what is best for them. A good practice for all is to drink plenty of water.   Will you be a social outcast?  Have you met anyone who has an ostomy and is a social outcast? Why should you be the first? Only your attitude and self image will effect how you are treated. No confi-dent person is an outcast.    PROFESSIONAL HELP   Resources are available if you need help or have questions about your ostomy.   Specially trained nurses called Wound, Ostomy Continence Nurses (WOCN) are available for  consultation in most major medical centers.  Consider getting an ostomy consult at an outpatient ostomy clinic.   [facility] an [facility] run by an WOCN ostomy nurse at the [facility].  [phone]. [facility] can help set up an appointment   The United Ostomy Association (UOA) is a group made up of many local chapters throughout the [place]. These local groups hold meetings and provide support to prospective and existing ostomates. They sponsor educational events and have qualified visitors to make personal or telephone visits. Contact the UOA for the chapter nearest you and for other educational publications.  More detailed information can be found in Colostomy Guide, a publication of the United Ostomy Association (UOA). Contact UOA at 1-800-826-0826 or visit their web site at www.uoaa.org. The website contains links to other sites, suppliers and resources.  [facility] Secure Start Services: Start at the website to enlist for support.  Your Wound Ostomy (WOCN) nurse may have started this   process. https://www.hollister.com/en/securestart Secure Start services are designed to support people as they live their lives with an ostomy or neurogenic bladder. Enrolling is easy and at no cost to the patient. We realize that each person's needs and life journey are different. Through Secure Start services, we want to help people live their life, their way.  #######################################################  

## 2021-02-19 NOTE — Plan of Care (Signed)

## 2021-03-02 ENCOUNTER — Other Ambulatory Visit: Payer: Self-pay | Admitting: Interventional Cardiology

## 2021-03-05 ENCOUNTER — Encounter: Payer: Self-pay | Admitting: Family Medicine

## 2021-03-05 ENCOUNTER — Ambulatory Visit: Payer: 59 | Admitting: Family Medicine

## 2021-03-05 VITALS — BP 114/72 | HR 88 | Temp 98.9°F | Wt 185.2 lb

## 2021-03-05 DIAGNOSIS — E876 Hypokalemia: Secondary | ICD-10-CM | POA: Diagnosis not present

## 2021-03-05 DIAGNOSIS — E119 Type 2 diabetes mellitus without complications: Secondary | ICD-10-CM | POA: Diagnosis not present

## 2021-03-05 DIAGNOSIS — Z9049 Acquired absence of other specified parts of digestive tract: Secondary | ICD-10-CM

## 2021-03-05 DIAGNOSIS — I10 Essential (primary) hypertension: Secondary | ICD-10-CM

## 2021-03-05 MED ORDER — POTASSIUM CHLORIDE CRYS ER 20 MEQ PO TBCR: 20.0000 meq | EXTENDED_RELEASE_TABLET | Freq: Every day | ORAL | 0 refills | Status: DC

## 2021-03-05 NOTE — Progress Notes (Signed)
Subjective:  ? ? Patient ID: Gail Lloyd, female    DOB: 12-28-1965, 56 y.o.   MRN: [id number] ? ?Chief Complaint  ?Patient presents with  ? Follow-up  ?  BP  ? ? ?HPI ?Patient was seen today for follow-up on HTN.  Patient states BP has been doing great at home.  Typically in the 1 teens/70s.  Patient taking Coreg 6.25 mg twice daily, losartan 50 mg daily, Norvasc 5 mg daily.  Patient states blood sugar has also been doing great.  Typically 120s.  Had a low blood sugar of 60 after forgetting to eat lunch.  Patient denies dizziness, headaches, CP, LE edema. ? ?Patient doing well status post robotic assisted right colectomy on 02/17/2021 to remove a cecal mass noted on colonoscopy.  Mass was found to be benign.  Patient endorses flatus, regular BMs.  Denies abdominal pain, bloating, nausea, vomiting.  Patient has follow-up with surgeon next week. ? ?Hypokalemia, potassium 3.1 noted on 02/19/2021.  Patient does not recall being given potassium replacement.  Patient states she started increase intake of potassium rich vegetables including sweet potatoes. ? ?Past Medical History:  ?Diagnosis Date  ? Heart murmur   ? Hyperlipidemia   ? on meds  and controlled  ? Hypertension   ? controlled on meds  ? Pre-diabetes   ? Prediabetes   ? no meds  ? ? ?Allergies  ?Allergen Reactions  ? Penicillins Hives and Itching  ?  Tolerated cephalosporin (02/17/21)  ? Sudafed [Pseudoephedrine Hcl] Palpitations  ? ? ?ROS ?General: Denies fever, chills, night sweats, changes in weight, changes in appetite ?HEENT: Denies headaches, ear pain, changes in vision, rhinorrhea, sore throat ?CV: Denies CP, palpitations, SOB, orthopnea ?Pulm: Denies SOB, cough, wheezing ?GI: Denies abdominal pain, nausea, vomiting, diarrhea, constipation ?GU: Denies dysuria, hematuria, frequency, vaginal discharge ?Msk: Denies muscle cramps, joint pains ?Neuro: Denies weakness, numbness, tingling ?Skin: Denies rashes, bruising ?Psych: Denies depression, anxiety,  hallucinations ? ?   ?Objective:  ?  ?Blood pressure 114/72, pulse 88, temperature 98.9 ?F (37.2 ?C), temperature source Oral, weight 185 lb 3.2 oz (84 kg), last menstrual period 01/31/2017, SpO2 100 %. ? ?Gen. Pleasant, well-nourished, in no distress, normal affect   ?HEENT: [place]/AT, face symmetric, conjunctiva clear, no scleral icterus, PERRLA, EOMI, nares patent without drainage ?Lungs: no accessory muscle use, CTAB, no wheezes or rales ?Cardiovascular: RRR, no m/r/g, no peripheral edema ?Abdomen: BS present, soft, NT/ND, no hepatosplenomegaly. ?Musculoskeletal: No deformities, no cyanosis or clubbing, normal tone ?Neuro:  A&Ox3, CN II-XII intact, normal gait ?Skin:  Warm, no lesions/ rash.  Laparoscopic surgical incisions on abdomen healing well without erythema, drainage, odor. ? ? ?Wt Readings from Last 3 Encounters:  ?03/05/21 185 lb 3.2 oz (84 kg)  ?02/19/21 186 lb 4.6 oz (84.5 kg)  ?02/01/21 185 lb (83.9 kg)  ? ? ?Lab Results  ?Component Value Date  ? WBC 9.4 02/19/2021  ? HGB 10.3 (L) 02/19/2021  ? HCT 31.4 (L) 02/19/2021  ? PLT 223 02/19/2021  ? GLUCOSE 103 (H) 02/19/2021  ? CHOL 124 06/05/2020  ? TRIG 113 06/05/2020  ? HDL 48 06/05/2020  ? LDLCALC 56 06/05/2020  ? ALT 12 08/14/2020  ? AST 12 08/14/2020  ? NA 140 02/19/2021  ? K 3.1 (L) 02/19/2021  ? CL 107 02/19/2021  ? CREATININE 0.89 02/19/2021  ? BUN 15 02/19/2021  ? CO2 26 02/19/2021  ? TSH 1.37 08/14/2020  ? HGBA1C 6.3 (H) 02/01/2021  ? ? ?Assessment/Plan: ? ?Essential hypertension ?-Well  controlled ?-Continue checking BP at home regularly. ?-For now continue losartan 50 mg daily, Coreg 6.25 mg twice daily, Norvasc 5 mg daily. ?-If patient experiences consistent hypotension will hold Norvasc. ? ?Diet-controlled diabetes mellitus ([place]) ?-Hemoglobin A1c 6.3% on 02/01/2021 ?-Continue lifestyle modifications ?-Continue monitoring blood sugar. ? ?Hypokalemia  ?-Potassium 3.1 on 02/19/2021 ?- Plan: potassium chloride SA (KLOR-CON M) 20 MEQ tablet ? ?Status  post right colectomy ?-Stable ?-Done 02/17/2021 for cecal mass noted on colonoscopy ?-Continue follow-up with general surgery ? ?F/u as needed in the next 2 months, sooner if needed ? ?Grier Mitts, MD ?

## 2021-03-06 ENCOUNTER — Other Ambulatory Visit: Payer: Self-pay | Admitting: Family Medicine

## 2021-03-06 DIAGNOSIS — E876 Hypokalemia: Secondary | ICD-10-CM

## 2021-03-07 ENCOUNTER — Other Ambulatory Visit: Payer: Self-pay | Admitting: Family Medicine

## 2021-03-07 DIAGNOSIS — E876 Hypokalemia: Secondary | ICD-10-CM

## 2021-03-08 ENCOUNTER — Other Ambulatory Visit: Payer: Self-pay | Admitting: Family Medicine

## 2021-03-08 DIAGNOSIS — E876 Hypokalemia: Secondary | ICD-10-CM

## 2021-03-11 ENCOUNTER — Encounter: Payer: Self-pay | Admitting: Internal Medicine

## 2021-03-11 NOTE — Progress Notes (Signed)
Recall placed per Dr. Libby Maw request. Letter sent via My Chart and mailed to home address. ?

## 2021-03-19 ENCOUNTER — Other Ambulatory Visit: Payer: Self-pay | Admitting: Family Medicine

## 2021-03-19 DIAGNOSIS — N6310 Unspecified lump in the right breast, unspecified quadrant: Secondary | ICD-10-CM

## 2021-04-14 ENCOUNTER — Ambulatory Visit
Admission: RE | Admit: 2021-04-14 | Discharge: 2021-04-14 | Disposition: A | Discharge status: Home / Self Care | Payer: 59 | Source: Ambulatory Visit | Attending: Family Medicine | Admitting: Family Medicine

## 2021-04-14 DIAGNOSIS — R922 Inconclusive mammogram: Secondary | ICD-10-CM | POA: Diagnosis not present

## 2021-04-14 DIAGNOSIS — N6310 Unspecified lump in the right breast, unspecified quadrant: Secondary | ICD-10-CM

## 2021-04-27 ENCOUNTER — Other Ambulatory Visit: Payer: Self-pay | Admitting: *Deleted

## 2021-04-27 MED ORDER — ROSUVASTATIN CALCIUM 10 MG PO TABS: ORAL_TABLET | ORAL | 0 refills | Status: DC

## 2021-04-27 MED ORDER — ROSUVASTATIN CALCIUM 10 MG PO TABS
ORAL_TABLET | ORAL | 0 refills | Status: DC
Start: 2021-04-27 — End: 2021-05-10

## 2021-05-10 ENCOUNTER — Other Ambulatory Visit: Payer: Self-pay | Admitting: Interventional Cardiology

## 2021-05-11 ENCOUNTER — Other Ambulatory Visit: Payer: Self-pay | Admitting: Interventional Cardiology

## 2021-05-11 DIAGNOSIS — K006 Disturbances in tooth eruption: Secondary | ICD-10-CM | POA: Diagnosis not present

## 2021-06-01 ENCOUNTER — Other Ambulatory Visit: Payer: Self-pay | Admitting: Interventional Cardiology

## 2021-06-06 NOTE — Progress Notes (Unsigned)
Cardiology Office Note:    Date:  06/08/2021   ID:  [name], DOB Sep 17, 1965, MRN [id number]  PCP:  Billie Ruddy, MD   [facility] Providers Cardiologist:  Larae Grooms, MD     Referring MD: Billie Ruddy, MD   Chief Complaint: follow-up palpitations  History of Present Illness:    Gail Lloyd is a very pleasant 56 y.o. female with a hx of palpitations, hypertension and elevated coronary calcium score.  She was referred by PCP for evaluation of palpitations and hypertension and was seen in our office on 02/25/2020 by Dr.Varanasi.  She reported heart fluttering started several months prior and seem to be intensifying over the previous few months.  The symptoms came regardless of position or activity level.  She avoids caffeine.  No syncope, occasional dizziness.  Symptoms can last several minutes at a time.  She also has a family history of early CAD.  Cardiac monitor showed normal sinus rhythm with rare PACs and PVCs, no pathologic arrhythmias.  CT revealed coronary calcium score of 7.46, 83rd percentile for age and sex matched control.  She was advised to start Crestor 10 mg daily and return for lipid and liver testing in 3 months.  She was advised to return in 1 year for follow-up.  Today, she is here alone for her follow-up.  Reports she is feeling well, having palpitations approximately 2 times per week. These occur at various times and are not burdensome to her. Has pain in the area of the tricep muscle of her left arm. She denies activity intolerance, chest pain, shortness of breath, lower extremity edema, presyncope, syncope, orthopnea, PND.  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 She tries to eat a heart healthy diet and avoid saturated fats.   Past Medical History:  Diagnosis Date   Heart murmur    Hyperlipidemia    on meds  and controlled   Hypertension    controlled on meds   Pre-diabetes     Prediabetes    no meds    Past Surgical History:  Procedure Laterality Date   WISDOM TOOTH EXTRACTION     age 16    Current Medications: No outpatient medications have been marked as taking for the 06/08/21 encounter (Office Visit) with Ann Maki, Lanice Schwab, NP.     Allergies:   Penicillins and Sudafed [pseudoephedrine hcl]   Social History   Socioeconomic History   Marital status: Single    Spouse name: Not on file   Number of children: Not on file   Years of education: Not on file   Highest education level: Not on file  Occupational History   Not on file  Tobacco Use   Smoking status: Never   Smokeless tobacco: Never  Vaping Use   Vaping Use: Never used  Substance and Sexual Activity   Alcohol use: No   Drug use: No   Sexual activity: Not on file  Other Topics Concern   Not on file  Social History Narrative   Not on file   Social Determinants of Health   Financial Resource Strain: Not on file  Food Insecurity: Not on file  Transportation Needs: Not on file  Physical Activity: Not on file  Stress: Not on file  Social Connections: Not on file     Family History: The patient's family history includes Breast cancer (age of onset: 59) in her maternal aunt; Cancer in her father;  Diabetes in her mother; Hypertension in her father and mother. There is no history of Colon cancer, Colon polyps, Esophageal cancer, Rectal cancer, or Stomach cancer.  ROS:   Please see the history of present illness.    + left arm pain All other systems reviewed and are negative.  Labs/Other Studies Reviewed:    The following studies were reviewed today:  CT Cardiac Scoring 03/03/20  IMPRESSION: Coronary calcium score of 7.46. This was 1 percentile for age and sex matched control.   Cardiac monitor 03/23/20  Normal sinus rhythm with rare PACs and PVCs. No pathologic arrhythmias.   Recent Labs: 06/07/2021: ALT 13; BUN 19; Creatinine, Ser 0.82; Hemoglobin 11.8; Platelets 294.0;  Potassium 3.6; Sodium 140; TSH 1.31  Recent Lipid Panel    Component Value Date/Time   CHOL 110 06/07/2021 1120   CHOL 124 06/05/2020 0805   TRIG 74.0 06/07/2021 1120   HDL 47.50 06/07/2021 1120   HDL 48 06/05/2020 0805   CHOLHDL 2 06/07/2021 1120   VLDL 14.8 06/07/2021 1120   LDLCALC 47 06/07/2021 1120   LDLCALC 56 06/05/2020 0805   LDLCALC 115 (H) 12/02/2019 1436     Risk Assessment/Calculations:       Physical Exam:    VS:  BP 100/68 (BP Location: Left Arm, Patient Position: Sitting, Cuff Size: Normal)   Pulse 78   Ht '5\' 7"'$  (1.702 m)   Wt 185 lb 6.4 oz (84.1 kg)   LMP 01/31/2017   BMI 29.04 kg/m     Wt Readings from Last 3 Encounters:  06/08/21 185 lb 6.4 oz (84.1 kg)  06/07/21 184 lb 3.2 oz (83.6 kg)  03/05/21 185 lb 3.2 oz (84 kg)     GEN:  Well nourished, well developed in no acute distress HEENT: Normal NECK: No JVD; No carotid bruits CARDIAC: RRR, no murmurs, rubs, gallops RESPIRATORY:  Clear to auscultation without rales, wheezing or rhonchi  ABDOMEN: Soft, non-tender, non-distended MUSCULOSKELETAL:  No edema; No deformity. 2+ pedal pulses, equal bilaterally SKIN: Warm and dry NEUROLOGIC:  Alert and oriented x 3 PSYCHIATRIC:  Normal affect   EKG:  EKG is ordered today.  The ekg ordered today demonstrates NSR at 78 bpm, no ST/T wave abnormality  Diagnoses:    1. Hyperlipidemia LDL goal <70   2. Palpitations   3. Left arm pain    Assessment and Plan:     Palpitations: Occasional palpitations.  No lightheadedness, presyncope, syncope associated with palpitations.  No chest pain.  These are not interfering with her regular activities. Continue carvedilol.   Elevated coronary calcium score/Hyperlipidemia LDL goal < 70: LDL 47 on 06/07/21.  We discussed the importance of LDL less than 70 due to elevated coronary calcium score.  She exercises on a consistent basis and tries to eat a heart healthy diet.  Continue rosuvastatin.  Left arm pain: Left arm  pain in the area of the tricep. No radiation.  No chest pain.  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 Advised her to continue to monitor and notify us if she has any chest pain.     Disposition: 1 year with Dr. Irish Lack  Medication Adjustments/Labs and Tests Ordered: Current medicines are reviewed at length with the patient today.  Concerns regarding medicines are outlined above.  Orders Placed This Encounter  Procedures   EKG 12-Lead   Meds ordered this encounter  Medications   rosuvastatin (CRESTOR) 10 MG tablet    Sig: TAKE 1 TABLET BY  MOUTH DAILY    Dispense:  90 tablet    Refill:  3    Patient Instructions  Medication Instructions:   Your physician recommends that you continue on your current medications as directed. Please refer to the Current Medication list given to you today.  *If you need a refill on your cardiac medications before your next appointment, please call your pharmacy*  Lab Work:  None ordered.  If you have labs (blood work) drawn today and your tests are completely normal, you will receive your results only by: [place] (if you have MyChart) OR A paper copy in the mail If you have any lab test that is abnormal or we need to change your treatment, we will call you to review the results.  Testing/Procedures:  None ordered  Follow-Up: At [facility], you and your health needs are our priority.  As part of our continuing mission to provide you with exceptional heart care, we have created designated Provider Care Teams.  These Care Teams include your primary Cardiologist (physician) and Advanced Practice Providers (APPs -  Physician Assistants and Nurse Practitioners) who all work together to provide you with the care you need, when you need it.  We recommend signing up for the patient portal called "MyChart".  Sign up information is provided on this After Visit Summary.  MyChart is used to connect with patients for Virtual  Visits (Telemedicine).  Patients are able to view lab/test results, encounter notes, upcoming appointments, etc.  Non-urgent messages can be sent to your provider as well.   To learn more about what you can do with MyChart, go to [email or web address].    Your next appointment:   1 year(s)  The format for your next appointment:   In Person  Provider:   Larae Grooms, MD     Other Instructions  Your physician wants you to follow-up in: 1 year with Dr.Varanasi.  You will receive a reminder letter in the mail two months in advance. If you don't receive a letter, please call our office to schedule the follow-up appointment.   Important Information About Sugar         Signed, Gail Life, NP  06/08/2021 8:33 AM    [place]

## 2021-06-07 ENCOUNTER — Ambulatory Visit (INDEPENDENT_AMBULATORY_CARE_PROVIDER_SITE_OTHER): Payer: 59 | Admitting: Family Medicine

## 2021-06-07 ENCOUNTER — Encounter: Payer: Self-pay | Admitting: Family Medicine

## 2021-06-07 VITALS — BP 116/78 | HR 80 | Temp 98.0°F | Ht 68.0 in | Wt 184.2 lb

## 2021-06-07 DIAGNOSIS — Z0000 Encounter for general adult medical examination without abnormal findings: Secondary | ICD-10-CM

## 2021-06-07 DIAGNOSIS — I10 Essential (primary) hypertension: Secondary | ICD-10-CM

## 2021-06-07 DIAGNOSIS — Z1159 Encounter for screening for other viral diseases: Secondary | ICD-10-CM | POA: Diagnosis not present

## 2021-06-07 DIAGNOSIS — Z23 Encounter for immunization: Secondary | ICD-10-CM | POA: Diagnosis not present

## 2021-06-07 DIAGNOSIS — E119 Type 2 diabetes mellitus without complications: Secondary | ICD-10-CM

## 2021-06-07 LAB — COMPREHENSIVE METABOLIC PANEL
ALT: 13 U/L (ref 0–35)
AST: 12 U/L (ref 0–37)
Albumin: 4.1 g/dL (ref 3.5–5.2)
Alkaline Phosphatase: 54 U/L (ref 39–117)
BUN: 19 mg/dL (ref 6–23)
CO2: 28 mEq/L (ref 19–32)
Calcium: 9.6 mg/dL (ref 8.4–10.5)
Chloride: 104 mEq/L (ref 96–112)
Creatinine, Ser: 0.82 mg/dL (ref 0.40–1.20)
GFR: 79.97 mL/min (ref >60.00)
Glucose, Bld: 97 mg/dL (ref 70–99)
Potassium: 3.6 mEq/L (ref 3.5–5.1)
Sodium: 140 mEq/L (ref 135–145)
Total Bilirubin: 0.6 mg/dL (ref 0.2–1.2)
Total Protein: 7.9 g/dL (ref 6.0–8.3)

## 2021-06-07 LAB — CBC WITH DIFFERENTIAL/PLATELET
Basophils Absolute: 0 10*3/uL (ref 0.0–0.1)
Basophils Relative: 0.3 % (ref 0.0–3.0)
Eosinophils Absolute: 0.2 10*3/uL (ref 0.0–0.7)
Eosinophils Relative: 2.9 % (ref 0.0–5.0)
HCT: 35.6 % — ABNORMAL LOW (ref 36.0–46.0)
Hemoglobin: 11.8 g/dL — ABNORMAL LOW (ref 12.0–15.0)
Lymphocytes Relative: 32.8 % (ref 12.0–46.0)
Lymphs Abs: 1.9 10*3/uL (ref 0.7–4.0)
MCHC: 33.1 g/dL (ref 30.0–36.0)
MCV: 89.5 fl (ref 78.0–100.0)
Monocytes Absolute: 0.6 10*3/uL (ref 0.1–1.0)
Monocytes Relative: 11 % (ref 3.0–12.0)
Neutro Abs: 3 10*3/uL (ref 1.4–7.7)
Neutrophils Relative %: 53 % (ref 43.0–77.0)
Platelets: 294 10*3/uL (ref 150.0–400.0)
RBC: 3.97 Mil/uL (ref 3.87–5.11)
RDW: 13.8 % (ref 11.5–15.5)
WBC: 5.7 10*3/uL (ref 4.0–10.5)

## 2021-06-07 LAB — MICROALBUMIN / CREATININE URINE RATIO
Creatinine,U: 156.9 mg/dL
Microalb Creat Ratio: 0.6 mg/g (ref 0.0–30.0)
Microalb, Ur: 1 mg/dL (ref 0.0–1.9)

## 2021-06-07 LAB — LIPID PANEL
Cholesterol: 110 mg/dL (ref 0–200)
HDL: 47.5 mg/dL (ref >39.00)
LDL Cholesterol: 47 mg/dL (ref 0–99)
NonHDL: 62.2
Total CHOL/HDL Ratio: 2
Triglycerides: 74 mg/dL (ref 0.0–149.0)
VLDL: 14.8 mg/dL (ref 0.0–40.0)

## 2021-06-07 LAB — TSH: TSH: 1.31 u[IU]/mL (ref 0.35–5.50)

## 2021-06-07 LAB — HEMOGLOBIN A1C: Hgb A1c MFr Bld: 6.5 % (ref 4.6–6.5)

## 2021-06-07 LAB — T4, FREE: Free T4: 0.81 ng/dL (ref 0.60–1.60)

## 2021-06-07 NOTE — Progress Notes (Signed)
Subjective:     Gail Lloyd is a 56 y.o. female and is here for a comprehensive physical exam. The patient reports no problems.  States is doing well overall.  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  Patient denies abdominal pain.  Pt thinks last pap was several yrs ago.  Patient had mammogram 04/14/2021.  Colonoscopy done 01/14/2021.  Social History   Socioeconomic History   Marital status: Single    Spouse name: Not on file   Number of children: Not on file   Years of education: Not on file   Highest education level: Not on file  Occupational History   Not on file  Tobacco Use   Smoking status: Never   Smokeless tobacco: Never  Vaping Use   Vaping Use: Never used  Substance and Sexual Activity   Alcohol use: No   Drug use: No   Sexual activity: Not on file  Other Topics Concern   Not on file  Social History Narrative   Not on file   Social Determinants of Health   Financial Resource Strain: Not on file  Food Insecurity: Not on file  Transportation Needs: Not on file  Physical Activity: Not on file  Stress: Not on file  Social Connections: Not on file  Intimate Partner Violence: Not on file   Health Maintenance  Topic Date Due   OPHTHALMOLOGY EXAM  Never done   HIV Screening  Never done   Hepatitis C Screening  Never done   TETANUS/TDAP  Never done   PAP SMEAR-Modifier  Never done   Zoster Vaccines- Shingrix (1 of 2) Never done   COVID-19 Vaccine (3 - Booster for Pfizer series) 05/18/2019   HEMOGLOBIN A1C  08/01/2021   INFLUENZA VACCINE  08/03/2021   FOOT EXAM  11/06/2021   MAMMOGRAM  04/15/2023   COLONOSCOPY (Pts 45-11yr Insurance coverage will need to be confirmed)  01/15/2031   HPV VACCINES  Aged Out    The following portions of the patient's history were reviewed and updated as appropriate: allergies, current medications, past family history, past medical history, past social history, past surgical  history, and problem list.  Review of Systems Pertinent items noted in HPI and remainder of comprehensive ROS otherwise negative.   Objective:    BP 116/78 (BP Location: Left Arm, Patient Position: Sitting, Cuff Size: Normal)   Pulse 80   Temp 98 F (36.7 C) (Oral)   Ht '5\' 8"'$  (1.727 m)   Wt 184 lb 3.2 oz (83.6 kg)   LMP 01/31/2017   SpO2 98%   BMI 28.01 kg/m  General appearance: alert, cooperative, and no distress Head: Normocephalic, without obvious abnormality, atraumatic Eyes: conjunctivae/corneas clear. PERRL, EOM's intact. Fundi benign. Ears: normal TM's and external ear canals both ears Nose: Nares normal. Septum midline. Mucosa normal. No drainage or sinus tenderness. Throat: lips, mucosa, and tongue normal; teeth and gums normal Neck: no adenopathy, no carotid bruit, no JVD, supple, symmetrical, trachea midline, and thyroid not enlarged, symmetric, no tenderness/mass/nodules Lungs: clear to auscultation bilaterally Heart: regular rate and rhythm, S1, S2 normal, no murmur, click, rub or gallop Abdomen: soft, non-tender; bowel sounds normal; no masses,  no organomegaly Extremities: extremities normal, atraumatic, no cyanosis or edema Pulses: 2+ and symmetric Skin: Skin color, texture, turgor normal. No rashes or lesions Lymph nodes: Cervical, supraclavicular, and axillary nodes normal. Neurologic: Alert and oriented X 3, normal strength and tone. Normal symmetric reflexes. Normal coordination and  gait    Assessment:    Healthy female exam.      Plan:    Anticipatory guidance given including wearing seatbelts, smoke detectors in the home, increasing physical activity, increasing p.o. intake of water and vegetables. -obtain labs -mammogram done 04/14/21 -colonoscopy done 01/14/21 -Discussed scheduling pap with OB/GYN. -Immunizations reviewed.  Discussed obtaining shingles and pneumonia vaccine.  Shingles vaccine given this visit -Given handout -Next CPE in 1 year See  After Visit Summary for Counseling Recommendations   Essential hypertension -Well-controlled -Continue current medications including Coreg 6.25 mg twice daily, losartan 50 mg daily, Norvasc 5 mg daily. -Continue lifestyle modifications -If hypotension develops hold Norvasc. - Plan: Lipid panel, CMP  Diet-controlled diabetes mellitus ([place])  -Hemoglobin A1c 6.3% on 02/01/2021 -Continue lifestyle modifications -Eye exam scheduled next month -Continue ARB and statin -foot exam done 11/06/2020 - Plan: Hemoglobin A1c, Lipid panel, Microalbumin/Creatinine Ratio, Urine  Encounter for hepatitis C screening test for low risk patient  - Plan: Hep C Antibody  Need for shingles vaccine -Given this visit  Follow-up in 6 months as needed  Grier Mitts, MD

## 2021-06-07 NOTE — Addendum Note (Signed)
Addended by: Anderson Malta on: 06/07/2021 11:29 AM   Modules accepted: Orders

## 2021-06-08 ENCOUNTER — Encounter: Payer: Self-pay | Admitting: Nurse Practitioner

## 2021-06-08 ENCOUNTER — Ambulatory Visit (INDEPENDENT_AMBULATORY_CARE_PROVIDER_SITE_OTHER): Payer: 59 | Admitting: Nurse Practitioner

## 2021-06-08 VITALS — BP 100/68 | HR 78 | Ht 67.0 in | Wt 185.4 lb

## 2021-06-08 DIAGNOSIS — E785 Hyperlipidemia, unspecified: Secondary | ICD-10-CM

## 2021-06-08 DIAGNOSIS — R002 Palpitations: Secondary | ICD-10-CM

## 2021-06-08 DIAGNOSIS — M79602 Pain in left arm: Secondary | ICD-10-CM

## 2021-06-08 LAB — HEPATITIS C ANTIBODY
Hepatitis C Ab: NONREACTIVE
SIGNAL TO CUT-OFF: 0.06 (ref <1.00)

## 2021-06-08 MED ORDER — ROSUVASTATIN CALCIUM 10 MG PO TABS: ORAL_TABLET | ORAL | 3 refills | Status: DC

## 2021-06-08 NOTE — Patient Instructions (Signed)
Medication Instructions:   Your physician recommends that you continue on your current medications as directed. Please refer to the Current Medication list given to you today.  *If you need a refill on your cardiac medications before your next appointment, please call your pharmacy*  Lab Work:  None ordered.  If you have labs (blood work) drawn today and your tests are completely normal, you will receive your results only by: [place] (if you have MyChart) OR A paper copy in the mail If you have any lab test that is abnormal or we need to change your treatment, we will call you to review the results.  Testing/Procedures:  None ordered  Follow-Up: At [facility], you and your health needs are our priority.  As part of our continuing mission to provide you with exceptional heart care, we have created designated Provider Care Teams.  These Care Teams include your primary Cardiologist (physician) and Advanced Practice Providers (APPs -  Physician Assistants and Nurse Practitioners) who all work together to provide you with the care you need, when you need it.  We recommend signing up for the patient portal called "MyChart".  Sign up information is provided on this After Visit Summary.  MyChart is used to connect with patients for Virtual Visits (Telemedicine).  Patients are able to view lab/test results, encounter notes, upcoming appointments, etc.  Non-urgent messages can be sent to your provider as well.   To learn more about what you can do with MyChart, go to [email or web address].    Your next appointment:   1 year(s)  The format for your next appointment:   In Person  Provider:   Larae Grooms, MD     Other Instructions  Your physician wants you to follow-up in: 1 year with Dr.Varanasi.  You will receive a reminder letter in the mail two months in advance. If you don't receive a letter, please call our office to schedule the follow-up appointment.   Important  Information About Sugar

## 2021-06-14 ENCOUNTER — Ambulatory Visit (HOSPITAL_COMMUNITY)
Admission: EM | Admit: 2021-06-14 | Discharge: 2021-06-14 | Disposition: A | Discharge status: Home / Self Care | Payer: 59 | Attending: Family Medicine | Admitting: Family Medicine

## 2021-06-14 ENCOUNTER — Encounter (HOSPITAL_COMMUNITY): Payer: Self-pay | Admitting: Emergency Medicine

## 2021-06-14 DIAGNOSIS — J029 Acute pharyngitis, unspecified: Secondary | ICD-10-CM | POA: Insufficient documentation

## 2021-06-14 LAB — POCT RAPID STREP A, ED / UC: Streptococcus, Group A Screen (Direct): NEGATIVE

## 2021-06-14 MED ORDER — KETOROLAC TROMETHAMINE 30 MG/ML IJ SOLN
30.0000 mg | Freq: Once | INTRAMUSCULAR | Status: AC
  Administered 2021-06-14: 30 mg via INTRAMUSCULAR

## 2021-06-14 MED ORDER — IBUPROFEN 800 MG PO TABS: 800.0000 mg | ORAL_TABLET | Freq: Three times a day (TID) | ORAL | 0 refills | Status: AC | PRN

## 2021-06-14 MED ORDER — KETOROLAC TROMETHAMINE 30 MG/ML IJ SOLN
INTRAMUSCULAR | Status: AC
  Filled 2021-06-14: qty 1

## 2021-06-14 NOTE — ED Triage Notes (Signed)
Pt is present today with a sore throat.Pt sx started x3 days ago

## 2021-06-14 NOTE — Discharge Instructions (Addendum)
Your strep test is negative.  Culture of the throat will be sent, and staff will notify you if that is in turn positive.  Take ibuprofen 800 mg--1 tab every 8 hours as needed for pain.  You have been given a shot of Toradol 30 mg today.  

## 2021-06-14 NOTE — ED Provider Notes (Signed)
[facility]    CSN: [id number] Arrival date & time: 06/14/21  1650      History   Chief Complaint Chief Complaint  Patient presents with   Sore Throat    HPI Gail Lloyd is a 56 y.o. female.    Sore Throat   Here with a history of sore throat since June 9.  No cough or nasal congestion.  Some fever in the first day.  No vomiting or diarrhea.  Past medical history is significant for prediabetes which is improved on last testing.  Also she has hypertension and she takes medicine for that  She notes a penicillin allergy which gave her a rash when she was very young.  She did tolerate cefotetan well when it was given in the preop.  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 Authorizing Provider  ibuprofen (ADVIL) 800 MG tablet Take 1 tablet (800 mg total) by mouth every 8 (eight) hours as needed (pain). 06/14/21  Yes Barrett Henle, MD  amLODipine (NORVASC) 5 MG tablet TAKE 1 TABLET(5 MG) BY MOUTH DAILY 08/14/20   Billie Ruddy, MD  carvedilol (COREG) 6.25 MG  tablet Take 1 tablet (6.25 mg total) by mouth 2 (two) times daily with a meal. 11/06/20   Billie Ruddy, MD  diphenhydrAMINE (BENADRYL) 25 MG tablet Take 25 mg by mouth every 6 (six) hours as needed for allergies.    [provider]  fluticasone (FLONASE) 50 MCG/ACT nasal spray SHAKE LIQUID AND USE 1 SPRAY IN EACH NOSTRIL DAILY 01/01/20   Billie Ruddy, MD  Garlic 1583 MG CAPS Take 1,000 mg by mouth daily.    [provider]  ipratropium (ATROVENT) 0.06 % nasal spray Place 2 sprays into both nostrils 4 (four) times daily. Patient taking differently: Place 2 sprays into both nostrils 2 (two) times daily as needed for rhinitis. 02/23/17   Tasia Catchings, Amy V, PA-C  losartan (COZAAR) 50 MG tablet Take 1 tablet (50 mg total) by mouth daily. 11/06/20   Billie Ruddy, MD  magnesium gluconate (MAGONATE) 500 MG tablet Take 500 mg by mouth daily.    [provider]  Multiple Vitamin (  MULTIVITAMIN WITH MINERALS) TABS tablet Take 1 tablet by mouth daily.    [provider]  Multiple Vitamins-Minerals (EMERGEN-C IMMUNE PLUS/VIT D) CHEW Chew 1 each by mouth daily.    [provider]  potassium chloride SA (KLOR-CON M) 20 MEQ tablet Take 1 tablet (20 mEq total) by mouth daily for 3 days. 03/05/21 03/08/21  Billie Ruddy, MD  rosuvastatin (CRESTOR) 10 MG tablet TAKE 1 TABLET BY MOUTH DAILY 06/08/21 06/09/22  Swinyer, Lanice Schwab, NP  traMADol (ULTRAM) 50 MG tablet Take 1-2 tablets (50-100 mg total) by mouth every 6 (six) hours as needed for moderate pain. 6/83/41   Leighton Ruff, MD    Family History Family History  Problem Relation Age of Onset   Diabetes Mother    Hypertension Mother    Hypertension Father    Cancer Father    Breast cancer Maternal Aunt 30   Colon cancer Neg Hx    Colon polyps Neg Hx    Esophageal cancer Neg Hx    Rectal cancer Neg Hx    Stomach cancer Neg Hx     Social History Social History   Tobacco Use   Smoking status: Never   Smokeless  tobacco: Never  Vaping Use   Vaping Use: Never used  Substance Use Topics   Alcohol use: No   Drug use: No     Allergies   Penicillins and Sudafed [pseudoephedrine hcl]   Review of Systems Review of Systems   Physical Exam Triage Vital Signs ED Triage Vitals  Enc Vitals Group     BP 06/14/21 1848 (!) 156/83     Pulse Rate 06/14/21 1848 96     Resp 06/14/21 1848 18     Temp 06/14/21 1848 99.2 F (37.3 C)     Temp src --      SpO2 06/14/21 1848 99 %     Weight --      Height --      Head Circumference --      Peak Flow --      Pain Score 06/14/21 1849 10     Pain Loc --      Pain Edu? --      Excl. in [place]? --    No data found.  Updated Vital Signs BP (!) 156/83   Pulse 96   Temp 99.2 F (37.3 C)   Resp 18   LMP 01/31/2017   SpO2 99%   Visual Acuity Right Eye Distance:   Left Eye Distance:   Bilateral Distance:    Right Eye Near:   Left Eye Near:    Bilateral Near:     Physical Exam Vitals reviewed.  Constitutional:      General: She is not in acute distress.    Appearance: She is not toxic-appearing.  HENT:     Right Ear: Tympanic membrane and ear canal normal.     Left Ear: Tympanic membrane and ear canal normal.     Nose: Nose normal.     Mouth/Throat:     Mouth: Mucous membranes are moist.     Comments: There is some erythema of the posterior oropharynx, and some clear mucus Eyes:     Extraocular Movements: Extraocular movements intact.     Conjunctiva/sclera: Conjunctivae normal.     Pupils: Pupils are equal, round, and reactive to light.  Cardiovascular:     Rate and Rhythm: Normal rate and regular rhythm.     Heart sounds: No murmur heard. Pulmonary:  Effort: Pulmonary effort is normal. No respiratory distress.     Breath sounds: No wheezing, rhonchi or rales.  Chest:     Chest wall: No tenderness.  Musculoskeletal:     Cervical back: Neck supple.  Lymphadenopathy:     Cervical: No cervical adenopathy.  Skin:    Capillary  Refill: Capillary refill takes less than 2 seconds.     Coloration: Skin is not jaundiced or pale.     Findings: No rash.  Neurological:     General: No focal deficit present.     Mental Status: She is alert and oriented to person, place, and time.  Psychiatric:        Behavior: Behavior normal.      UC Treatments / Results  Labs (all labs ordered are listed, but only abnormal results are displayed) Labs Reviewed  CULTURE, GROUP A STREP [facility])  POCT RAPID STREP A, ED / UC    EKG   Radiology No results found.  Procedures Procedures (including critical care time)  Medications Ordered in UC Medications  ketorolac (TORADOL) 30 MG/ML injection 30 mg (has no administration in time range)    Initial Impression / Assessment and Plan / UC Course  I have reviewed the triage vital signs and the nursing notes.  Pertinent labs & imaging results that were available during my care of the patient were reviewed by me and considered in my medical decision making (see chart for details).     Rapid strep test is negative, so we will send culture and treat per protocol if positive Final Clinical Impressions(s) / UC Diagnoses   Final diagnoses:  Acute pharyngitis, unspecified etiology     Discharge Instructions      Your strep test is negative.  Culture of the throat will be sent, and staff will notify you if that is in turn positive.  Take ibuprofen 800 mg--1 tab every 8 hours as needed for pain.  You have been given a shot of Toradol 30 mg today.        ED Prescriptions     Medication Sig Dispense Auth. Provider   ibuprofen (ADVIL) 800 MG tablet Take 1 tablet (800 mg total) by mouth every 8 (eight) hours as needed (pain). 21 tablet Arber Wiemers, Gwenlyn Perking, MD      PDMP not reviewed this encounter.   Barrett Henle, MD 06/14/21 [phone]

## 2021-06-17 LAB — CULTURE, GROUP A STREP (THRC)

## 2021-06-29 ENCOUNTER — Other Ambulatory Visit: Payer: Self-pay | Admitting: Interventional Cardiology

## 2021-08-01 ENCOUNTER — Other Ambulatory Visit: Payer: Self-pay | Admitting: Family Medicine

## 2021-08-01 DIAGNOSIS — I10 Essential (primary) hypertension: Secondary | ICD-10-CM

## 2021-09-30 ENCOUNTER — Ambulatory Visit (INDEPENDENT_AMBULATORY_CARE_PROVIDER_SITE_OTHER): Payer: 59

## 2021-09-30 DIAGNOSIS — Z23 Encounter for immunization: Secondary | ICD-10-CM | POA: Diagnosis not present

## 2021-09-30 DIAGNOSIS — E538 Deficiency of other specified B group vitamins: Secondary | ICD-10-CM

## 2021-09-30 MED ORDER — CYANOCOBALAMIN 1000 MCG/ML IJ SOLN: 1000.0000 ug | Freq: Once | INTRAMUSCULAR | Status: DC

## 2021-10-23 ENCOUNTER — Other Ambulatory Visit: Payer: Self-pay | Admitting: Family Medicine

## 2021-10-23 DIAGNOSIS — I10 Essential (primary) hypertension: Secondary | ICD-10-CM

## 2021-10-25 ENCOUNTER — Other Ambulatory Visit: Payer: Self-pay | Admitting: Family Medicine

## 2021-10-25 DIAGNOSIS — I10 Essential (primary) hypertension: Secondary | ICD-10-CM

## 2021-10-29 DIAGNOSIS — Z01419 Encounter for gynecological examination (general) (routine) without abnormal findings: Secondary | ICD-10-CM | POA: Diagnosis not present

## 2021-11-10 ENCOUNTER — Ambulatory Visit (INDEPENDENT_AMBULATORY_CARE_PROVIDER_SITE_OTHER): Payer: 59 | Admitting: Family Medicine

## 2021-11-10 VITALS — BP 138/74 | HR 93 | Temp 99.5°F | Wt 178.0 lb

## 2021-11-10 DIAGNOSIS — R350 Frequency of micturition: Secondary | ICD-10-CM | POA: Diagnosis not present

## 2021-11-10 DIAGNOSIS — M545 Low back pain, unspecified: Secondary | ICD-10-CM | POA: Diagnosis not present

## 2021-11-10 DIAGNOSIS — R6883 Chills (without fever): Secondary | ICD-10-CM | POA: Diagnosis not present

## 2021-11-10 DIAGNOSIS — E119 Type 2 diabetes mellitus without complications: Secondary | ICD-10-CM | POA: Diagnosis not present

## 2021-11-10 LAB — POCT URINALYSIS DIPSTICK
Bilirubin, UA: NEGATIVE
Glucose, UA: NEGATIVE
Ketones, UA: NEGATIVE
Leukocytes, UA: NEGATIVE
Nitrite, UA: NEGATIVE
Protein, UA: POSITIVE — AB
Spec Grav, UA: 1.025 (ref 1.010–1.025)
Urobilinogen, UA: NEGATIVE E.U./dL — AB
pH, UA: 6 (ref 5.0–8.0)

## 2021-11-10 NOTE — Progress Notes (Signed)
Subjective:    Patient ID: Gail Lloyd, female    DOB: May 26, 1965, 56 y.o.   MRN: [id number]  Chief Complaint  Patient presents with   Hemoglobin A1c Screening    Has been using the bathroom more lately, feeling cold. Urine was darker on Sunday, not sure if it a uti.     HPI Patient is a 56 yo female who was seen today for acute concern.  Pt endorses urinary frequency and feeling cold. Notes that urine was darker in color on Sunday.  Also with left-sided low back pain x2 weeks.  Patient denies seeing blood in toilet or on toilet paper.  Endorses constipation.  Thought sx would be better s/p R colectomy 02/17/21 for cecal mass noted on colonoscopy.  Past Medical History:  Diagnosis Date   Heart murmur    Hyperlipidemia    on meds  and controlled   Hypertension    controlled on meds   Pre-diabetes    Prediabetes    no meds    Allergies  Allergen Reactions   Penicillins Hives and Itching    Tolerated cephalosporin (02/17/21)   Sudafed [Pseudoephedrine Hcl] Palpitations    ROS General: Denies fever, chills, night sweats, changes in weight, changes in appetite  +chills HEENT: Denies headaches, ear pain, changes in vision, rhinorrhea, sore throat CV: Denies CP, palpitations, SOB, orthopnea Pulm: Denies SOB, cough, wheezing GI: Denies abdominal pain, nausea, vomiting, diarrhea+ constipation GU: Denies dysuria, hematuria, frequency, vaginal discharge  +urinary freq Msk: Denies muscle cramps, joint pains + left-sided low back pain Neuro: Denies weakness, numbness, tingling Skin: Denies rashes, bruising Psych: Denies depression, anxiety, hallucinations .     Objective:    Blood pressure 138/74, pulse 93, temperature 99.5 F (37.5 C), temperature source Oral, weight 178 lb (80.7 kg), last menstrual period 01/31/2017, SpO2 98 %.  Gen. Pleasant, well-nourished, in no distress, normal affect   HEENT: [place]/AT, face symmetric, conjunctiva clear, no scleral icterus, PERRLA, EOMI, nares  patent without drainage Lungs: no accessory muscle use, CTAB, no wheezes or rales Cardiovascular: RRR, no m/r/g, no peripheral edema Abdomen: BS present, soft, NT/ND, no hepatosplenomegaly.  No CVA tenderness b/l. Musculoskeletal: No deformities, no cyanosis or clubbing, normal tone Neuro:  A&Ox3, CN II-XII intact, normal gait Skin:  Warm, no lesions/ rash   Wt Readings from Last 3 Encounters:  11/10/21 178 lb (80.7 kg)  06/08/21 185 lb 6.4 oz (84.1 kg)  06/07/21 184 lb 3.2 oz (83.6 kg)    Lab Results  Component Value Date   WBC 5.7 06/07/2021   HGB 11.8 (L) 06/07/2021   HCT 35.6 (L) 06/07/2021   PLT 294.0 06/07/2021   GLUCOSE 97 06/07/2021   CHOL 110 06/07/2021   TRIG 74.0 06/07/2021   HDL 47.50 06/07/2021   LDLCALC 47 06/07/2021   ALT 13 06/07/2021   AST 12 06/07/2021   NA 140 06/07/2021   K 3.6 06/07/2021   CL 104 06/07/2021   CREATININE 0.82 06/07/2021   BUN 19 06/07/2021   CO2 28 06/07/2021   TSH 1.31 06/07/2021   HGBA1C 6.5 06/07/2021   MICROALBUR 1.0 06/07/2021    Assessment/Plan:  Urinary frequency - Plan: POCT urinalysis dipstick, Culture, Urine, CMP, CBC with Differential/Platelet, Urinalysis with Reflex Microscopic  Acute left-sided low back pain without sciatica - Plan: Culture, Urine, CMP, Urinalysis with Reflex Microscopic  Chills - Plan: Culture, Urine, CBC with Differential/Platelet, Urinalysis with Reflex Microscopic  Diet-controlled diabetes mellitus ([place]) - Plan: Hemoglobin A1c  Pt with ongoing  urinary symptoms.  Discussed symptoms possibly due to UTI, pyelo, renal calculi vs hyperglycemia.  POC UA with 3+ RBCs and protein.  Concern for renal calculi. Will obtain Ucx, labs, and UA with reflex micro.  If cx positive start abx, if neg obtain CT stone study.  Given strict precautions for continued or worsened symptoms.  F/u prn  Grier Mitts, MD

## 2021-11-11 LAB — COMPREHENSIVE METABOLIC PANEL
ALT: 15 U/L (ref 0–35)
AST: 13 U/L (ref 0–37)
Albumin: 3.8 g/dL (ref 3.5–5.2)
Alkaline Phosphatase: 59 U/L (ref 39–117)
BUN: 17 mg/dL (ref 6–23)
CO2: 31 mEq/L (ref 19–32)
Calcium: 9.2 mg/dL (ref 8.4–10.5)
Chloride: 103 mEq/L (ref 96–112)
Creatinine, Ser: 0.79 mg/dL (ref 0.40–1.20)
GFR: 83.38 mL/min (ref >60.00)
Glucose, Bld: 105 mg/dL — ABNORMAL HIGH (ref 70–99)
Potassium: 3.5 mEq/L (ref 3.5–5.1)
Sodium: 140 mEq/L (ref 135–145)
Total Bilirubin: 0.4 mg/dL (ref 0.2–1.2)
Total Protein: 7.6 g/dL (ref 6.0–8.3)

## 2021-11-11 LAB — URINALYSIS, ROUTINE W REFLEX MICROSCOPIC
Bilirubin Urine: NEGATIVE
Ketones, ur: NEGATIVE
Leukocytes,Ua: NEGATIVE
Nitrite: NEGATIVE
Specific Gravity, Urine: 1.025 (ref 1.000–1.030)
Total Protein, Urine: NEGATIVE
Urine Glucose: NEGATIVE
Urobilinogen, UA: 0.2 (ref 0.0–1.0)
pH: 6 (ref 5.0–8.0)

## 2021-11-11 LAB — CBC WITH DIFFERENTIAL/PLATELET
Basophils Absolute: 0.1 10*3/uL (ref 0.0–0.1)
Basophils Relative: 1.5 % (ref 0.0–3.0)
Eosinophils Absolute: 0.4 10*3/uL (ref 0.0–0.7)
Eosinophils Relative: 4.8 % (ref 0.0–5.0)
HCT: 33.5 % — ABNORMAL LOW (ref 36.0–46.0)
Hemoglobin: 11.2 g/dL — ABNORMAL LOW (ref 12.0–15.0)
Lymphocytes Relative: 27.5 % (ref 12.0–46.0)
Lymphs Abs: 2.4 10*3/uL (ref 0.7–4.0)
MCHC: 33.5 g/dL (ref 30.0–36.0)
MCV: 88.4 fl (ref 78.0–100.0)
Monocytes Absolute: 1 10*3/uL (ref 0.1–1.0)
Monocytes Relative: 12 % (ref 3.0–12.0)
Neutro Abs: 4.7 10*3/uL (ref 1.4–7.7)
Neutrophils Relative %: 54.2 % (ref 43.0–77.0)
Platelets: 332 10*3/uL (ref 150.0–400.0)
RBC: 3.79 Mil/uL — ABNORMAL LOW (ref 3.87–5.11)
RDW: 13.3 % (ref 11.5–15.5)
WBC: 8.6 10*3/uL (ref 4.0–10.5)

## 2021-11-11 LAB — URINE CULTURE
MICRO NUMBER:: 14161461
Result:: NO GROWTH
SPECIMEN QUALITY:: ADEQUATE

## 2021-11-11 LAB — HEMOGLOBIN A1C: Hgb A1c MFr Bld: 7 % — ABNORMAL HIGH (ref 4.6–6.5)

## 2021-11-12 ENCOUNTER — Other Ambulatory Visit: Payer: Self-pay | Admitting: Family Medicine

## 2021-11-12 DIAGNOSIS — R82998 Other abnormal findings in urine: Secondary | ICD-10-CM

## 2021-11-23 IMAGING — MR MR HEAD W/O CM
10 series · 42 of 48 positions shown · non-contrast
Comparison: Head CT November 27, 2019.

CLINICAL DATA: Mental status change.

EXAM:
MRI HEAD WITHOUT CONTRAST
TECHNIQUE: Multiplanar, multiecho pulse sequences of the brain and surrounding
structures were obtained without intravenous contrast.

[Series 5: dwi_tracew · axial · 3.0mm · 0.88mm/px · z∈[-48,+99]mm · 8 of 102 slices shown]
[im 1/102]
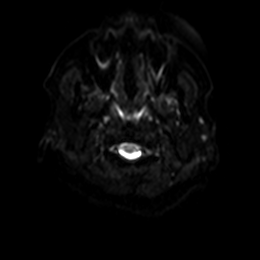
[im 21/102]
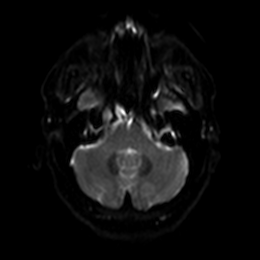
[im 31/102]
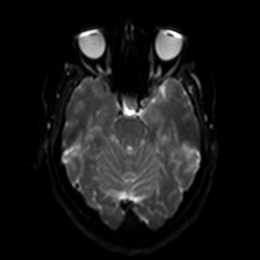
[im 41/102]
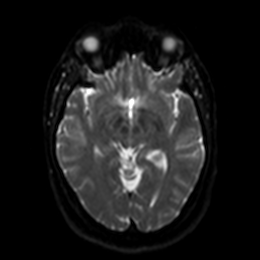
[im 61/102]
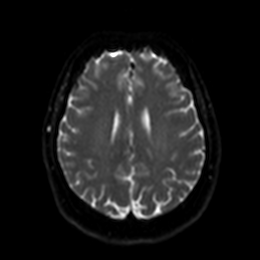
[im 71/102]
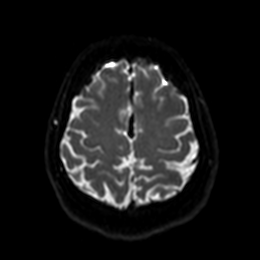
[im 81/102]
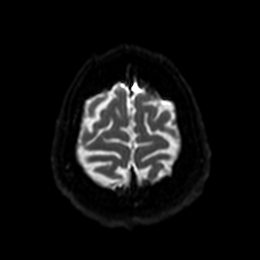
[im 102/102]
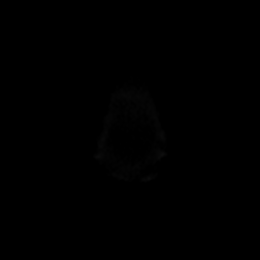

[Series 6: dwi_adc · axial · 3.0mm · 0.88mm/px · z∈[-48,-12]mm · 2 of 51 slices shown]
[im 1/51]
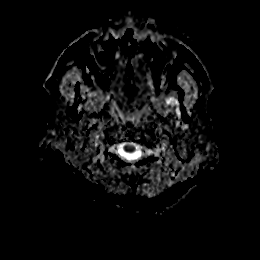
[im 13/51]
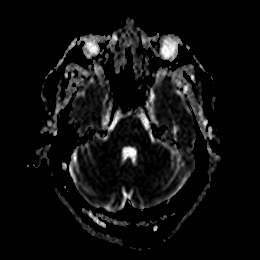

[Series 7: FLAIR · axial · 3.0mm · 0.86mm/px · z∈[-49,+98]mm · 5 of 51 slices shown]
[im 1/51]
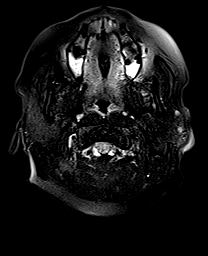
[im 13/51]
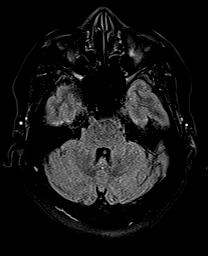
[im 26/51]
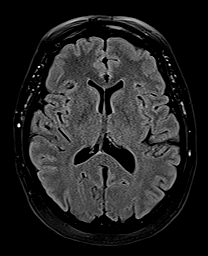
[im 38/51]
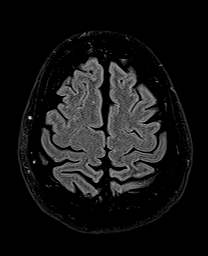
[im 51/51]
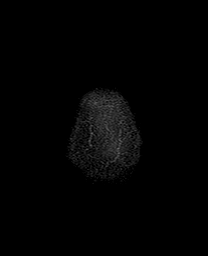

[Series 8: GRE · axial · 3.0mm · 0.43mm/px · z∈[-48,+98]mm · 5 of 51 slices shown]
[im 1/51]
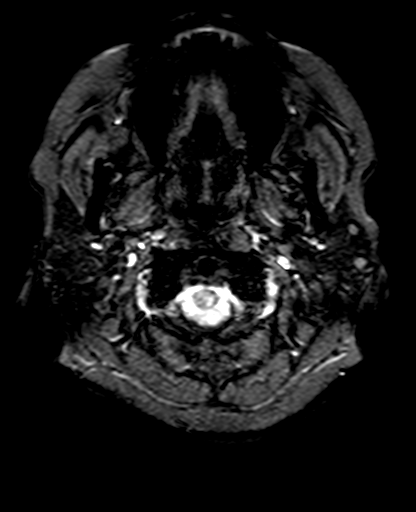
[im 13/51]
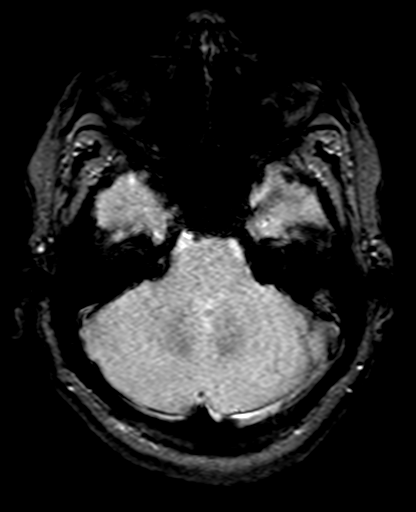
[im 26/51]
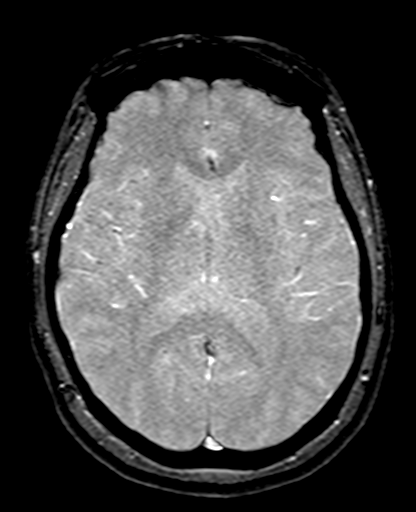
[im 38/51]
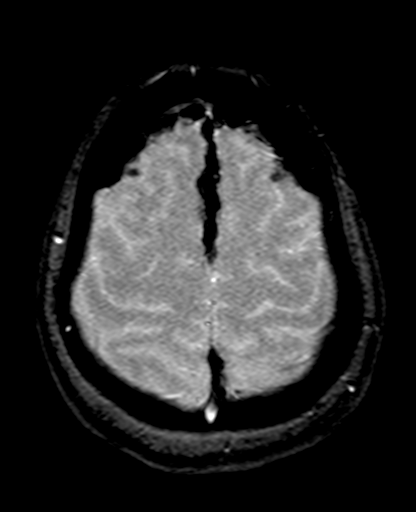
[im 51/51]
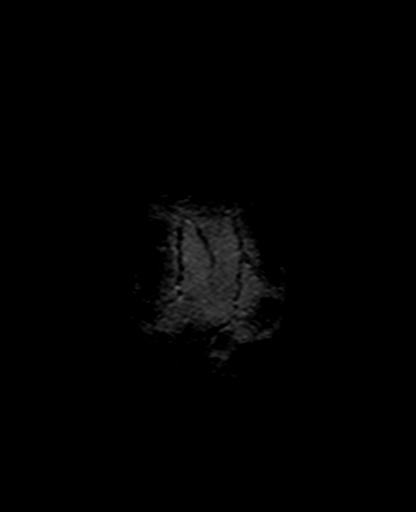

[Series 9: DWI · coronal · 5.0mm · 1.31mm/px · 6 of 60 slices shown (1 of 2)]
[im 1/60]
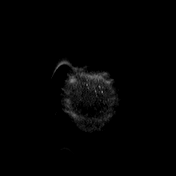
[im 12/60]
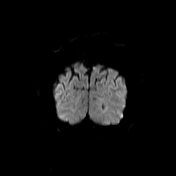
[im 24/60]
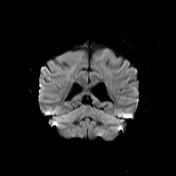
[im 36/60]
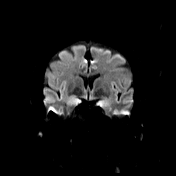
[im 48/60]
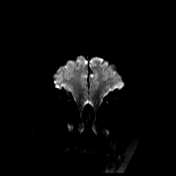
[im 60/60]
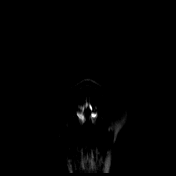

[Series 10: DWI · coronal · 5.0mm · 1.31mm/px · 3 of 30 slices shown (2 of 2)]
[im 1/30]
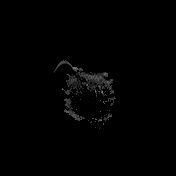
[im 15/30]
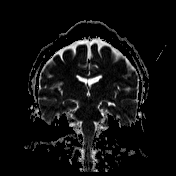
[im 30/30]
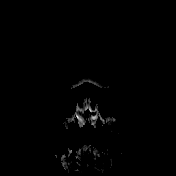

[Series 11: T2 · sagittal · 5.0mm · 0.47mm/px · 3 of 25 slices shown (1 of 3)]
[im 1/25]
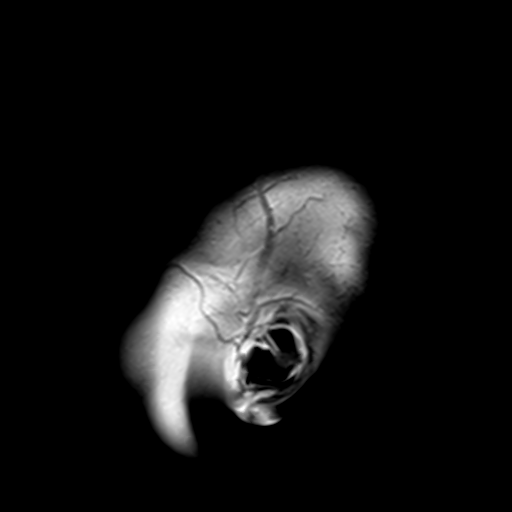
[im 13/25]
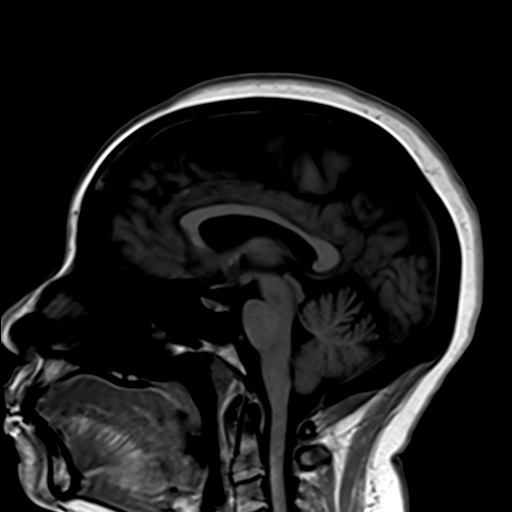
[im 25/25]
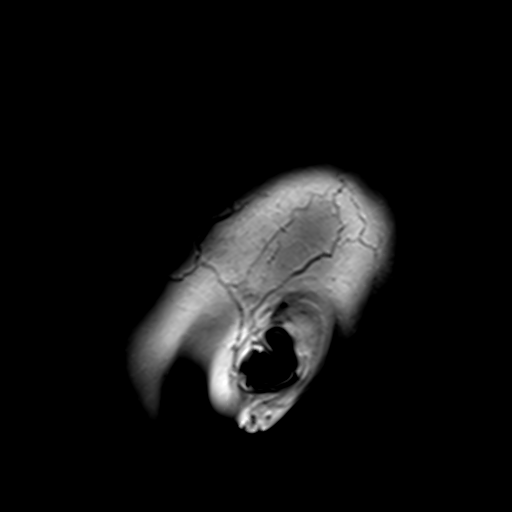

[Series 12: T2 · axial · 5.0mm · 0.43mm/px · z∈[-49,+97]mm · 2 of 24 slices shown (2 of 3)]
[im 1/24]
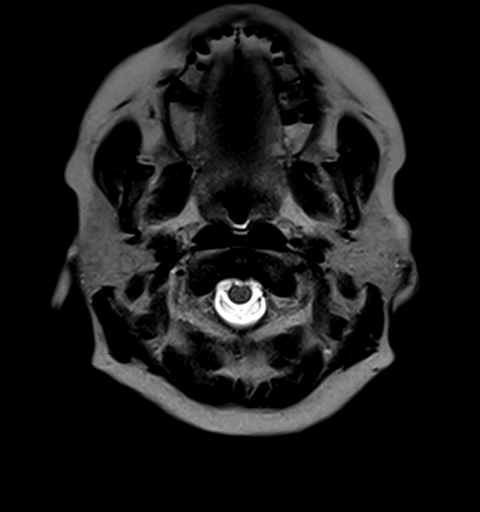
[im 24/24]
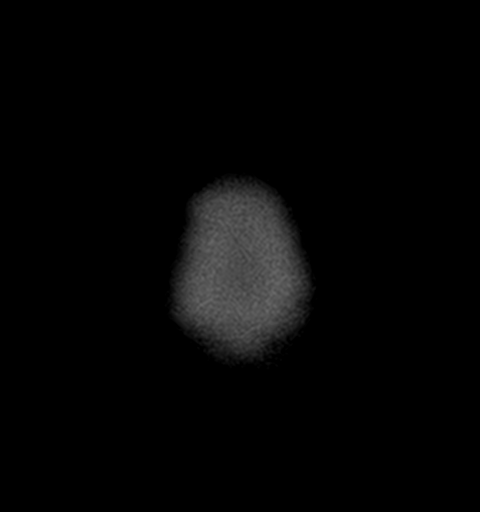

[Series 13: T1 · axial · 3.0mm · 0.43mm/px · z∈[-48,+98]mm · 5 of 51 slices shown]
[im 1/51]
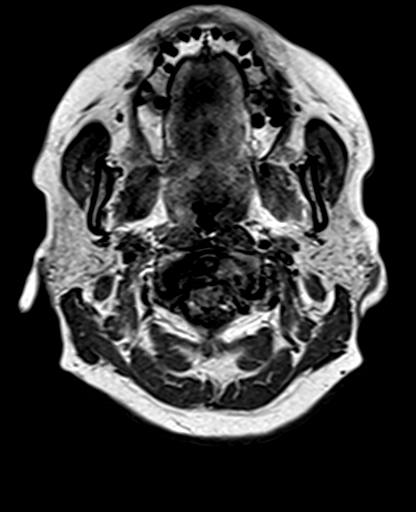
[im 13/51]
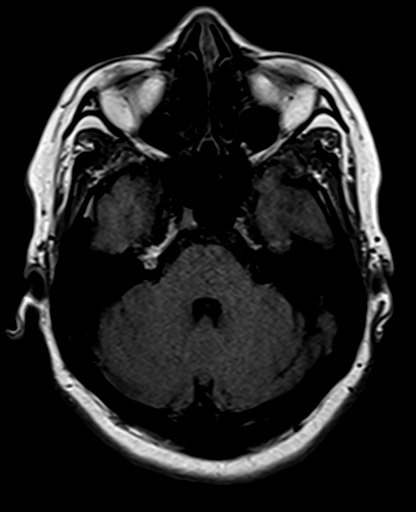
[im 26/51]
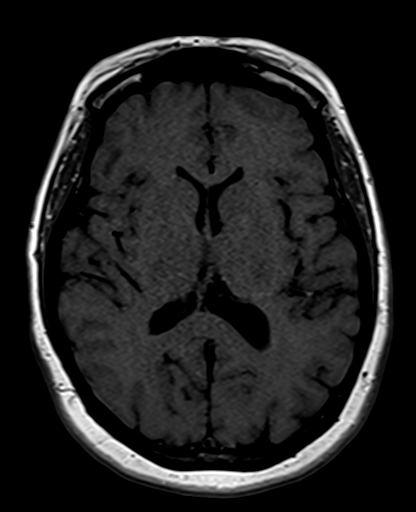
[im 38/51]
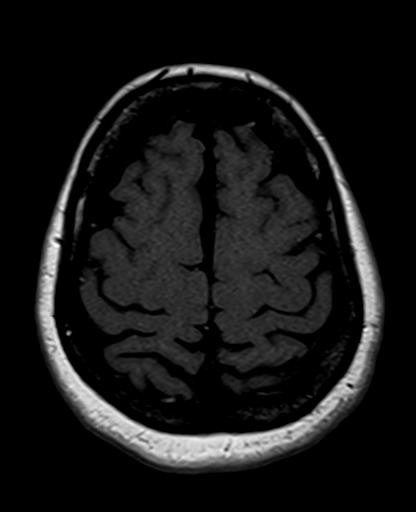
[im 51/51]
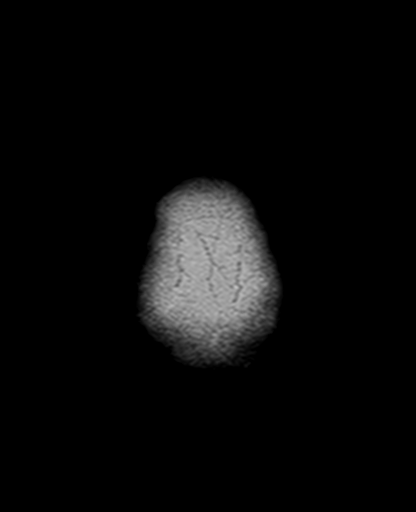

[Series 14: T2 · coronal · 5.0mm · 0.86mm/px · 3 of 29 slices shown (3 of 3)]
[im 1/29]
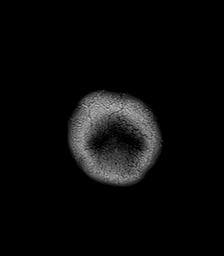
[im 15/29]
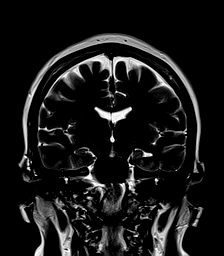
[im 29/29]
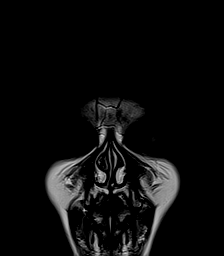

[42 of 48 positions shown; findings below may reference images not displayed]

FINDINGS: Brain: No acute infarction, hemorrhage, hydrocephalus, extra-axial
collection or mass lesion. Small amount of scattered foci of T2
hyperintensity are seen within white matter of the cerebral
hemispheres, nonspecific.

Vascular: Normal flow voids.

Skull and upper cervical spine: No focal marrow lesion.

Sinuses/Orbits: Mucosal thickening of the sphenoid sinuses. The
orbits are maintained.

Other: None.
IMPRESSION: 1. No acute intracranial abnormality.
2. Small amount of scattered foci of T2 hyperintensity within the
white matter of the cerebral hemispheres, nonspecific but may be
seen in the setting of chronic small vessel ischemic disease.

## 2021-11-30 ENCOUNTER — Encounter: Payer: Self-pay | Admitting: Family Medicine

## 2021-12-21 ENCOUNTER — Encounter: Payer: Self-pay | Admitting: Family Medicine

## 2021-12-21 ENCOUNTER — Telehealth: Payer: Self-pay | Admitting: Family Medicine

## 2021-12-21 NOTE — Telephone Encounter (Signed)
Needs auth for CT renal stone study cpt [phone] patient scheduled for tomorrow, needs this today

## 2021-12-22 ENCOUNTER — Other Ambulatory Visit: Payer: 59

## 2022-01-20 ENCOUNTER — Ambulatory Visit
Admission: RE | Admit: 2022-01-20 | Discharge: 2022-01-20 | Disposition: A | Discharge status: Home / Self Care | Payer: 59 | Source: Ambulatory Visit | Attending: Family Medicine | Admitting: Family Medicine

## 2022-01-20 DIAGNOSIS — Z9049 Acquired absence of other specified parts of digestive tract: Secondary | ICD-10-CM | POA: Diagnosis not present

## 2022-01-20 DIAGNOSIS — R82998 Other abnormal findings in urine: Secondary | ICD-10-CM

## 2022-01-20 DIAGNOSIS — R319 Hematuria, unspecified: Secondary | ICD-10-CM | POA: Diagnosis not present

## 2022-01-20 DIAGNOSIS — K5730 Diverticulosis of large intestine without perforation or abscess without bleeding: Secondary | ICD-10-CM | POA: Diagnosis not present

## 2022-01-20 DIAGNOSIS — I700 Atherosclerosis of aorta: Secondary | ICD-10-CM | POA: Diagnosis not present

## 2022-06-08 ENCOUNTER — Other Ambulatory Visit: Payer: Self-pay | Admitting: Family Medicine

## 2022-06-08 DIAGNOSIS — Z0000 Encounter for general adult medical examination without abnormal findings: Secondary | ICD-10-CM

## 2022-06-13 ENCOUNTER — Ambulatory Visit
Admission: RE | Admit: 2022-06-13 | Discharge: 2022-06-13 | Disposition: A | Discharge status: Home / Self Care | Payer: 59 | Source: Ambulatory Visit | Attending: Family Medicine | Admitting: Family Medicine

## 2022-06-13 DIAGNOSIS — Z0000 Encounter for general adult medical examination without abnormal findings: Secondary | ICD-10-CM

## 2022-06-13 DIAGNOSIS — Z1231 Encounter for screening mammogram for malignant neoplasm of breast: Secondary | ICD-10-CM | POA: Diagnosis not present

## 2022-06-26 ENCOUNTER — Other Ambulatory Visit: Payer: Self-pay | Admitting: Nurse Practitioner

## 2022-07-06 ENCOUNTER — Encounter: Payer: Self-pay | Admitting: Physician Assistant

## 2022-07-06 ENCOUNTER — Ambulatory Visit: Payer: 59 | Attending: Physician Assistant | Admitting: Physician Assistant

## 2022-07-06 VITALS — BP 126/74 | HR 90 | Ht 67.0 in | Wt 187.8 lb

## 2022-07-06 DIAGNOSIS — E785 Hyperlipidemia, unspecified: Secondary | ICD-10-CM

## 2022-07-06 DIAGNOSIS — R002 Palpitations: Secondary | ICD-10-CM

## 2022-07-06 DIAGNOSIS — I2510 Atherosclerotic heart disease of native coronary artery without angina pectoris: Secondary | ICD-10-CM | POA: Diagnosis not present

## 2022-07-06 DIAGNOSIS — I10 Essential (primary) hypertension: Secondary | ICD-10-CM | POA: Diagnosis not present

## 2022-07-06 HISTORY — DX: Atherosclerotic heart disease of native coronary artery without angina pectoris: I25.10

## 2022-07-06 MED ORDER — ROSUVASTATIN CALCIUM 10 MG PO TABS: 10.0000 mg | ORAL_TABLET | Freq: Every day | ORAL | 3 refills | Status: DC

## 2022-07-06 NOTE — Assessment & Plan Note (Signed)
She wore a ZIO monitor in 2022 that demonstrated rare PACs and PVCs.  She has had rapid palpitations for quite some time.  She thinks that maybe they are more prominent in the last few months.  We discussed proceeding with a repeat monitor now versus watchful waiting.  She would like to wait for now.  If her symptoms worsen, she will contact me.  At that point, I will order a 14-day Zio patch monitor.

## 2022-07-06 NOTE — Assessment & Plan Note (Signed)
Blood pressure is controlled.  Continue amlodipine 5 mg daily, carvedilol 6.25 mg twice daily, losartan 50 mg daily.  Obtain follow-up CMET.

## 2022-07-06 NOTE — Assessment & Plan Note (Signed)
Continue Crestor 10 mg daily.  Arrange fasting CMET, lipids.

## 2022-07-06 NOTE — Progress Notes (Signed)
Cardiology Office Note:    Date:  07/06/2022  ID:  Gail Lloyd, DOB 03-01-1965, MRN [id number] PCP: Deeann Saint, MD   [facility] Providers Cardiologist:  Lance Muss, MD       Patient Profile:      Coronary artery Ca2+ CAC score 03/03/2020: CAC score 7.46 (83rd percentile) Hypertension  Hyperlipidemia Prediabetes Palpitations  Monitor 03/2020: NSR, rare PACs and PVCs FHx of CAD      History of Present Illness:   Gail Lloyd is a 57 y.o. female returns for follow-up on coronary calcification, hypertension and palpitations.  She was last seen by Eligha Bridegroom, NP in June 2023. She is here alone.  She notes occasional palpitations that feel like rapid heartbeats.  She had somewhat similar symptoms 2 years ago when she wore her monitor.  She has not had chest pain, shortness of breath, syncope, orthopnea, leg edema.  She does not smoke.  ROS See HPI    Studies Reviewed:   EKG Interpretation Date/Time:  Wednesday July 06 2022 14:36:45 EDT Ventricular Rate:  90 PR Interval:  178 QRS Duration:  88 QT Interval:  386 QTC Calculation: 472 R Axis:   36  Text Interpretation: Normal sinus rhythm Low voltage QRS Non-specific ST-t changes No significant change since last tracing 06/08/21 Confirmed by Tereso Newcomer [phone]) on 07/06/2022 2:50:10 PM   Risk Assessment/Calculations:           Physical Exam:   VS:  BP 126/74   Pulse 90   Ht 5\' 7"  (1.702 m)   Wt 187 lb 12.8 oz (85.2 kg)   LMP 01/31/2017   SpO2 98%   BMI 29.41 kg/m    Wt Readings from Last 3 Encounters:  07/06/22 187 lb 12.8 oz (85.2 kg)  11/10/21 178 lb (80.7 kg)  06/08/21 185 lb 6.4 oz (84.1 kg)    Constitutional:      Appearance: Healthy appearance. Not in distress.  Neck:     Vascular: No carotid bruit. JVD normal.  Pulmonary:     Breath sounds: Normal breath sounds. No wheezing. No rales.  Cardiovascular:     Normal rate. Regular rhythm.     Murmurs: There is no murmur.  Edema:    Peripheral  edema absent.  Abdominal:     Palpations: Abdomen is soft.      ASSESSMENT AND PLAN:   Coronary artery calcification seen on CT scan Calcium score 7.46 placing her in the 83rd percentile by CT scan in 2022.  She is doing well without chest pain to suggest angina.  Continue Crestor 10 mg daily.  Follow-up 1 year.  Hyperlipidemia LDL goal <70 Continue Crestor 10 mg daily.  Arrange fasting CMET, lipids.  Essential hypertension Blood pressure is controlled.  Continue amlodipine 5 mg daily, carvedilol 6.25 mg twice daily, losartan 50 mg daily.  Obtain follow-up CMET.  Palpitations She wore a ZIO monitor in 2022 that demonstrated rare PACs and PVCs.  She has had rapid palpitations for quite some time.  She thinks that maybe they are more prominent in the last few months.  We discussed proceeding with a repeat monitor now versus watchful waiting.  She would like to wait for now.  If her symptoms worsen, she will contact me.  At that point, I will order a 14-day Zio patch monitor.    Dispo:  Return in about 1 year (around 07/06/2023) for Routine Follow Up with Dr. Eldridge Dace, or Tereso Newcomer, PA-C.  Signed, Tereso Newcomer, PA-C

## 2022-07-06 NOTE — Assessment & Plan Note (Signed)
Calcium score 7.46 placing her in the 83rd percentile by CT scan in 2022.  She is doing well without chest pain to suggest angina.  Continue Crestor 10 mg daily.  Follow-up 1 year.

## 2022-07-06 NOTE — Patient Instructions (Signed)
Medication Instructions:  Your physician recommends that you continue on your current medications as directed. Please refer to the Current Medication list given to you today.  *If you need a refill on your cardiac medications before your next appointment, please call your pharmacy*   Lab Work: Monday, 07/11/22:  COME ANYTIME AFTER 7:15 AM, FASTING, FOR:  CMET & LIPID  If you have labs (blood work) drawn today and your tests are completely normal, you will receive your results only by: MyChart Message (if you have MyChart) OR A paper copy in the mail If you have any lab test that is abnormal or we need to change your treatment, we will call you to review the results.   Testing/Procedures: None ordered   Follow-Up: At [facility], you and your health needs are our priority.  As part of our continuing mission to provide you with exceptional heart care, we have created designated Provider Care Teams.  These Care Teams include your primary Cardiologist (physician) and Advanced Practice Providers (APPs -  Physician Assistants and Nurse Practitioners) who all work together to provide you with the care you need, when you need it.  We recommend signing up for the patient portal called "MyChart".  Sign up information is provided on this After Visit Summary.  MyChart is used to connect with patients for Virtual Visits (Telemedicine).  Patients are able to view lab/test results, encounter notes, upcoming appointments, etc.  Non-urgent messages can be sent to your provider as well.   To learn more about what you can do with MyChart, go to [email or web address].    Your next appointment:   12 month(s)  Provider:   Lance Muss, MD     Other Instructions Send me a mychart message if your palpitations get worse

## 2022-07-11 ENCOUNTER — Ambulatory Visit: Payer: 59 | Attending: Interventional Cardiology

## 2022-07-11 DIAGNOSIS — I2510 Atherosclerotic heart disease of native coronary artery without angina pectoris: Secondary | ICD-10-CM

## 2022-07-11 LAB — COMPREHENSIVE METABOLIC PANEL
ALT: 16 IU/L (ref 0–32)
AST: 13 IU/L (ref 0–40)
Albumin: 4 g/dL (ref 3.8–4.9)
Alkaline Phosphatase: 77 IU/L (ref 44–121)
BUN/Creatinine Ratio: 21 (ref 9–23)
BUN: 19 mg/dL (ref 6–24)
Bilirubin Total: 0.5 mg/dL (ref 0.0–1.2)
CO2: 27 mmol/L (ref 20–29)
Calcium: 9.4 mg/dL (ref 8.7–10.2)
Chloride: 103 mmol/L (ref 96–106)
Creatinine, Ser: 0.89 mg/dL (ref 0.57–1.00)
Globulin, Total: 3.1 g/dL (ref 1.5–4.5)
Glucose: 127 mg/dL — ABNORMAL HIGH (ref 70–99)
Potassium: 3.8 mmol/L (ref 3.5–5.2)
Sodium: 141 mmol/L (ref 134–144)
Total Protein: 7.1 g/dL (ref 6.0–8.5)
eGFR: 76 mL/min/{1.73_m2} (ref >59)

## 2022-07-11 LAB — LIPID PANEL
Chol/HDL Ratio: 2.2 ratio (ref 0.0–4.4)
Cholesterol, Total: 125 mg/dL (ref 100–199)
HDL: 56 mg/dL (ref >39)
LDL Chol Calc (NIH): 53 mg/dL (ref 0–99)
Triglycerides: 78 mg/dL (ref 0–149)
VLDL Cholesterol Cal: 16 mg/dL (ref 5–40)

## 2022-07-24 ENCOUNTER — Other Ambulatory Visit: Payer: Self-pay | Admitting: Family Medicine

## 2022-07-24 DIAGNOSIS — I10 Essential (primary) hypertension: Secondary | ICD-10-CM

## 2022-09-28 ENCOUNTER — Other Ambulatory Visit: Payer: Self-pay | Admitting: Family Medicine

## 2022-09-28 DIAGNOSIS — I10 Essential (primary) hypertension: Secondary | ICD-10-CM

## 2022-10-31 ENCOUNTER — Other Ambulatory Visit: Payer: Self-pay | Admitting: Family Medicine

## 2022-10-31 DIAGNOSIS — I10 Essential (primary) hypertension: Secondary | ICD-10-CM

## 2022-11-29 ENCOUNTER — Ambulatory Visit: Payer: 59 | Admitting: Family Medicine

## 2022-11-29 ENCOUNTER — Encounter: Payer: Self-pay | Admitting: Family Medicine

## 2022-11-29 VITALS — BP 120/64 | HR 92 | Temp 99.0°F | Wt 188.6 lb

## 2022-11-29 DIAGNOSIS — I10 Essential (primary) hypertension: Secondary | ICD-10-CM | POA: Diagnosis not present

## 2022-11-29 MED ORDER — HYDROCHLOROTHIAZIDE 25 MG PO TABS: 25.0000 mg | ORAL_TABLET | Freq: Every morning | ORAL | 3 refills | Status: DC

## 2022-11-29 MED ORDER — CARVEDILOL 6.25 MG PO TABS: 6.2500 mg | ORAL_TABLET | Freq: Two times a day (BID) | ORAL | 0 refills | Status: DC

## 2022-11-29 NOTE — Progress Notes (Signed)
   Subjective:    Patient ID: Gail Lloyd, female    DOB: 1965-06-28, 57 y.o.   MRN: [id number]  HPI Here to discuss her BP. She has had a few occasions lately when she felt a little pressure in the head, and at work a coworker has gotten readings of 160's over 80's systolic. No chest pain or SOB. In July labs showed her renal function to be normal.    Review of Systems  Constitutional: Negative.   Respiratory: Negative.    Cardiovascular: Negative.        Objective:   Physical Exam Constitutional:      Appearance: Normal appearance.  Cardiovascular:     Rate and Rhythm: Normal rate and regular rhythm.     Pulses: Normal pulses.     Heart sounds: Normal heart sounds.  Pulmonary:     Effort: Pulmonary effort is normal.     Breath sounds: Normal breath sounds.  Musculoskeletal:     Right lower leg: No edema.     Left lower leg: No edema.  Neurological:     Mental Status: She is alert.           Assessment & Plan:  HTN with borderline control. We will add hydrochlorothiazide 25 mg every morning to her regimen. She will follow up with Dr. Salomon Fick in 3-4 weeks. Gershon Crane, MD

## 2022-12-25 ENCOUNTER — Other Ambulatory Visit: Payer: Self-pay | Admitting: Family Medicine

## 2022-12-25 DIAGNOSIS — I10 Essential (primary) hypertension: Secondary | ICD-10-CM

## 2023-01-24 ENCOUNTER — Ambulatory Visit (HOSPITAL_COMMUNITY)
Admission: RE | Admit: 2023-01-24 | Discharge: 2023-01-24 | Disposition: A | Discharge status: Home / Self Care | Payer: 59 | Source: Ambulatory Visit | Attending: Internal Medicine | Admitting: Internal Medicine

## 2023-01-24 ENCOUNTER — Encounter (HOSPITAL_COMMUNITY): Payer: Self-pay

## 2023-01-24 ENCOUNTER — Other Ambulatory Visit: Payer: Self-pay

## 2023-01-24 VITALS — BP 91/56 | HR 87 | Temp 97.9°F | Resp 18

## 2023-01-24 DIAGNOSIS — B9789 Other viral agents as the cause of diseases classified elsewhere: Secondary | ICD-10-CM | POA: Diagnosis not present

## 2023-01-24 DIAGNOSIS — J988 Other specified respiratory disorders: Secondary | ICD-10-CM | POA: Diagnosis not present

## 2023-01-24 LAB — POCT INFLUENZA A/B
Influenza A, POC: NEGATIVE
Influenza B, POC: NEGATIVE

## 2023-01-24 LAB — SARS CORONAVIRUS 2 (TAT 6-24 HRS): SARS Coronavirus 2: NEGATIVE

## 2023-01-24 MED ORDER — BENZONATATE 200 MG PO CAPS: 200.0000 mg | ORAL_CAPSULE | Freq: Three times a day (TID) | ORAL | 0 refills | Status: DC | PRN

## 2023-01-24 MED ORDER — FLUTICASONE PROPIONATE 50 MCG/ACT NA SUSP
2.0000 | Freq: Every day | NASAL | 0 refills | Status: AC
Start: 2023-01-24 — End: ?

## 2023-01-24 NOTE — ED Triage Notes (Signed)
Reports fever started yesterday-stated 100.4.  reports runny, stuffy nose, cough, body aches and facial pain

## 2023-01-24 NOTE — ED Triage Notes (Signed)
Patient works at [employer] of [employer] at employment

## 2023-01-24 NOTE — ED Provider Notes (Signed)
[facility]    CSN: [id number] Arrival date & time: 01/24/23  0913      History   Chief Complaint Chief Complaint  Patient presents with   Fever    Entered by patient   Appointment    9:30    HPI Gail Lloyd is a 58 y.o. female who presents with onset of cold symptoms 2 days, and fever yesterday of 100.4 and mild body aches today.     Past Medical History:  Diagnosis Date   Coronary artery calcification seen on CT scan 07/06/2022   CAC score 03/03/2020: CAC score 7.46 (83rd percentile)   Heart murmur    Hyperlipidemia    on meds  and controlled   Hypertension    controlled on meds   Pre-diabetes    Prediabetes    no meds    Patient Active Problem List   Diagnosis Date Noted   Coronary artery calcification seen on CT scan 07/06/2022   Hyperlipidemia LDL goal <70 07/06/2022   Palpitations 07/06/2022   Colon polyp 02/17/2021   Diet-controlled diabetes mellitus (HCC) 09/14/2020   Nasal congestion 07/29/2020   Left knee pain 05/26/2020   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 Authorizing Provider  benzonatate (TESSALON) 200 MG capsule Take 1 capsule (200 mg total) by mouth 3 (three) times daily as needed for cough. 01/24/23  Yes Rodriguez-Southworth, Nettie Elm, PA-C  fluticasone (FLONASE) 50 MCG/ACT nasal spray Place 2 sprays into both nostrils daily. 01/24/23  Yes Rodriguez-Southworth, Nettie Elm, PA-C  amLODipine (NORVASC) 5 MG tablet TAKE 1 TABLET(5 MG)  BY MOUTH DAILY 12/29/22   Deeann Saint, MD  ascorbic acid (VITAMIN C) 1000 MG tablet Take 500 mg by mouth every 30 (thirty) days. 07/29/20   [provider]  carvedilol (COREG) 6.25 MG tablet Take 1 tablet (6.25 mg total) by mouth 2 (two) times daily with a meal. 11/29/22   Nelwyn Salisbury, MD  diphenhydrAMINE (BENADRYL) 25 MG tablet Take 25 mg by mouth every 6 (six) hours as needed for allergies.    [provider]  hydrochlorothiazide (HYDRODIURIL) 25 MG tablet Take 1 tablet (25 mg total) by mouth in the morning. 11/29/22   Nelwyn Salisbury, MD  ibuprofen (ADVIL) 800 MG tablet Take 1 tablet (800 mg total) by mouth every 8 (eight) hours as needed (pain). 06/14/21   Zenia Resides, MD  losartan (COZAAR) 50 MG tablet TAKE 1 TABLET(50 MG) BY MOUTH DAILY 09/29/22   Deeann Saint, MD  Multiple Vitamin (MULTIVITAMIN WITH MINERALS) TABS tablet Take 1 tablet by mouth daily.    [provider]  Multiple Vitamins-Minerals (EMERGEN-C IMMUNE PLUS/VIT  D) CHEW Chew 1 each by mouth daily.    [provider]  rosuvastatin (CRESTOR) 10 MG tablet Take 1 tablet (10 mg total) by mouth daily. 07/06/22   Beatrice Lecher, PA-C    Family History Family History  Problem Relation Age of Onset   Diabetes Mother    Hypertension Mother    Hypertension Father    Cancer Father    Breast cancer Maternal Aunt 30   Colon cancer Neg Hx    Colon polyps Neg Hx    Esophageal cancer Neg Hx    Rectal cancer Neg Hx    Stomach cancer Neg Hx     Social History Social History   Tobacco Use   Smoking status: Never   Smokeless tobacco: Never  Vaping Use   Vaping status: Never Used  Substance Use Topics   Alcohol use: No   Drug use: No     Allergies   Penicillins and Sudafed [pseudoephedrine hcl]   Review of Systems Review of Systems As noted in HPI  Physical Exam Triage Vital Signs ED Triage Vitals  Encounter Vitals Group     BP 01/24/23 0953 99/65     Systolic BP  Percentile --      Diastolic BP Percentile --      Pulse Rate 01/24/23 0953 89     Resp 01/24/23 0953 18     Temp 01/24/23 0953 97.9 F (36.6 C)     Temp Source 01/24/23 0953 Oral     SpO2 01/24/23 0953 97 %     Weight --      Height --      Head Circumference --      Peak Flow --      Pain Score 01/24/23 0949 5     Pain Loc --      Pain Education --      Exclude from Growth Chart --    No data found.  Updated Vital Signs BP (!) 91/56 (BP Location: Right Arm)   Pulse 87   Temp 97.9 F (36.6 C) (Oral)   Resp 18   LMP 01/31/2017   SpO2 97%   Visual Acuity Right Eye Distance:   Left Eye Distance:   Bilateral Distance:    Right Eye Near:   Left Eye Near:    Bilateral Near:     Physical Exam  Physical Exam Vitals signs and nursing note reviewed.  Constitutional:      General: She is not in acute distress.    Appearance: Normal appearance. She is not ill-appearing, toxic-appearing or diaphoretic.  HENT:     Head: Normocephalic.     Right Ear: Tympanic membrane, ear canal and external ear normal.     Left Ear: Tympanic membrane, ear canal and external ear normal.     Nose: Nose normal.     Mouth/Throat:     Mouth: Mucous membranes are moist.  Eyes:     General: No scleral icterus.       Right eye: No discharge.        Left eye: No discharge.     Conjunctiva/sclera: Conjunctivae normal.  Neck:     Musculoskeletal: Neck supple. No neck rigidity.  Cardiovascular:     Rate and Rhythm: Normal rate and regular rhythm.     Heart sounds: No murmur.  Pulmonary:     Effort: Pulmonary effort is normal.     Breath sounds: Normal breath sounds.  Musculoskeletal: Normal range of motion.  Lymphadenopathy:     Cervical: No cervical adenopathy.  Skin:    General: Skin is warm and dry.     Coloration: Skin is not jaundiced.     Findings: No rash.  Neurological:     Mental Status: She is alert and oriented to person, place, and time.     Gait: Gait normal.   Psychiatric:        Mood and Affect: Mood normal.        Behavior: Behavior normal.        Thought Content: Thought content normal.        Judgment: Judgment normal.   UC Treatments / Results  Labs (all labs ordered are listed, but only abnormal results are displayed) Labs Reviewed  SARS CORONAVIRUS 2 (TAT 6-24 HRS)  POCT INFLUENZA A/B  Flu A&B negative Covid test pending  EKG   Radiology No results found.  Procedures Procedures (including critical care time)  Medications Ordered in UC Medications - No data to display  Initial Impression / Assessment and Plan / UC Course  I have reviewed the triage vital signs and the nursing notes.  Pertinent labs results that were available during my care of the patient were reviewed by me and considered in my medical decision making (see chart for details).  URI  We will call her when the Covid test comes back if positive I placed her on Sudafed and Tessalon in the mean time as noted.   Final Clinical Impressions(s) / UC Diagnoses   Final diagnoses:  Viral respiratory illness   Discharge Instructions   None    ED Prescriptions     Medication Sig Dispense Auth. Provider   benzonatate (TESSALON) 200 MG capsule Take 1 capsule (200 mg total) by mouth 3 (three) times daily as needed for cough. 30 capsule Rodriguez-Southworth, Justyn Langham, PA-C   fluticasone (FLONASE) 50 MCG/ACT nasal spray Place 2 sprays into both nostrils daily. 15.8 g Rodriguez-Southworth, Nettie Elm, PA-C      PDMP not reviewed this encounter.   Garey Ham, PA-C 01/24/23 1351

## 2023-01-31 ENCOUNTER — Telehealth (INDEPENDENT_AMBULATORY_CARE_PROVIDER_SITE_OTHER): Payer: 59 | Admitting: Family Medicine

## 2023-01-31 ENCOUNTER — Encounter: Payer: Self-pay | Admitting: Family Medicine

## 2023-01-31 ENCOUNTER — Ambulatory Visit: Payer: Self-pay | Admitting: Family Medicine

## 2023-01-31 DIAGNOSIS — J019 Acute sinusitis, unspecified: Secondary | ICD-10-CM

## 2023-01-31 MED ORDER — DOXYCYCLINE HYCLATE 100 MG PO CAPS: 100.0000 mg | ORAL_CAPSULE | Freq: Two times a day (BID) | ORAL | 0 refills | Status: DC

## 2023-01-31 NOTE — Progress Notes (Signed)
Patient ID: Gail Lloyd, female   DOB: May 07, 1965, 58 y.o.   MRN: [id number]  Virtual Visit via Video Note  I connected with Gail Lloyd on 01/31/23 at  9:15 AM EST by a video enabled telemedicine application and verified that I am speaking with the correct person using two identifiers.  Location patient: home Location provider:work or home office Persons participating in the virtual visit: patient, provider  I discussed the limitations of evaluation and management by telemedicine and the availability of in person appointments. The patient expressed understanding and agreed to proceed.   HPI:  Gail Lloyd is seen today via virtual visit with concern for sinusitis.  She actually had onset of symptoms over 10 days ago.  She went to urgent care last Tuesday was prescribed Tessalon and some kind nasal spray without much improvement.  She had worsening symptoms since then.  Daily headaches and facial pain.  Upper teeth pain.  Frequent yellowish to bloody nasal discharge.  No significant cough.  No nausea or vomiting.  Allergy to penicillin.   ROS: See pertinent positives and negatives per HPI.  Past Medical History:  Diagnosis Date   Coronary artery calcification seen on CT scan 07/06/2022   CAC score 03/03/2020: CAC score 7.46 (83rd percentile)   Heart murmur    Hyperlipidemia    on meds  and controlled   Hypertension    controlled on meds   Pre-diabetes    Prediabetes    no meds    Past Surgical History:  Procedure Laterality Date   COLON SURGERY     WISDOM TOOTH EXTRACTION     age 65    Family History  Problem Relation Age of Onset   Diabetes Mother    Hypertension Mother    Hypertension Father    Cancer Father    Breast cancer Maternal Aunt 30   Colon cancer Neg Hx    Colon polyps Neg Hx    Esophageal cancer Neg Hx    Rectal cancer Neg Hx    Stomach cancer Neg Hx     SOCIAL HX: Non-smoker   Current Outpatient Medications:    amLODipine (NORVASC) 5 MG tablet, TAKE 1  TABLET(5 MG) BY MOUTH DAILY, Disp: 30 tablet, Rfl: 0   ascorbic acid (VITAMIN C) 1000 MG tablet, Take 500 mg by mouth every 30 (thirty) days., Disp: , Rfl:    carvedilol (COREG) 6.25 MG tablet, Take 1 tablet (6.25 mg total) by mouth 2 (two) times daily with a meal., Disp: 180 tablet, Rfl: 0   diphenhydrAMINE (BENADRYL) 25 MG tablet, Take 25 mg by mouth every 6 (six) hours as needed for allergies., Disp: , Rfl:    doxycycline (VIBRAMYCIN) 100 MG capsule, Take 1 capsule (100 mg total) by mouth 2 (two) times daily., Disp: 20 capsule, Rfl: 0   fluticasone (FLONASE) 50 MCG/ACT nasal spray, Place 2 sprays into both nostrils daily., Disp: 15.8 g, Rfl: 0   hydrochlorothiazide (HYDRODIURIL) 25 MG tablet, Take 1 tablet (25 mg total) by mouth in the morning., Disp: 30 tablet, Rfl: 3   ibuprofen (ADVIL) 800 MG tablet, Take 1 tablet (800 mg total) by mouth every 8 (eight) hours as needed (pain)., Disp: 21 tablet, Rfl: 0   losartan (COZAAR) 50 MG tablet, TAKE 1 TABLET(50 MG) BY MOUTH DAILY, Disp: 90 tablet, Rfl: 1   Multiple Vitamin (MULTIVITAMIN WITH MINERALS) TABS tablet, Take 1 tablet by mouth daily., Disp: , Rfl:    Multiple Vitamins-Minerals (EMERGEN-C IMMUNE PLUS/VIT D) CHEW, Chew 1  each by mouth daily., Disp: , Rfl:    rosuvastatin (CRESTOR) 10 MG tablet, Take 1 tablet (10 mg total) by mouth daily., Disp: 90 tablet, Rfl: 3   benzonatate (TESSALON) 200 MG capsule, Take 1 capsule (200 mg total) by mouth 3 (three) times daily as needed for cough. (Patient not taking: Reported on 01/31/2023), Disp: 30 capsule, Rfl: 0  EXAM:  VITALS per patient if applicable:  GENERAL: alert, oriented, appears well and in no acute distress  HEENT: atraumatic, conjunttiva clear, no obvious abnormalities on inspection of external nose and ears  NECK: normal movements of the head and neck  LUNGS: on inspection no signs of respiratory distress, breathing rate appears normal, no obvious gross SOB, gasping or wheezing  CV: no  obvious cyanosis  MS: moves all visible extremities without noticeable abnormality  PSYCH/NEURO: pleasant and cooperative, no obvious depression or anxiety, speech and thought processing grossly intact  ASSESSMENT AND PLAN:  Discussed the following assessment and plan:  Acute sinusitis.  Progressive symptoms over the past 10 days.  Given progression of symptoms along with upper teeth pain and purulent nasal discharge start doxycycline 100 mg twice daily for 10 days.  Plenty fluids and rest.  Follow-up for any persistent or worsening symptoms     I discussed the assessment and treatment plan with the patient. The patient was provided an opportunity to ask questions and all were answered. The patient agreed with the plan and demonstrated an understanding of the instructions.   The patient was advised to call back or seek an in-person evaluation if the symptoms worsen or if the condition fails to improve as anticipated.     Evelena Peat, MD

## 2023-01-31 NOTE — Progress Notes (Signed)
Patient was unable to self-report due to a lack of equipment at home via telehealth

## 2023-01-31 NOTE — Telephone Encounter (Signed)
  Chief Complaint: sinus pain/congestion Symptoms: cough, headache, left earache, yellow sputum, dizziness when blowing nose Frequency: x 2 weeks Pertinent Negatives: Patient denies sore throat, chest pain, SOB Disposition: [] ED /[] Urgent Care (no appt availability in office) / [x] Appointment(In office/virtual)/ []  [facility]/ [] Home Care/ [] Refused Recommended Disposition /[] [facility]/ []  Follow-up with PCP Additional Notes: Patient states she was seen at urgent care last Tuesday and told it was a URI and given cough medicine and nasal spray. She states she has not improved only worsened. Patient requesting an antibiotic be sent in without having an appointment. Advised patient she will need to be assess/evaluated to be prescribed an antibiotic. Patient agreeable to virtual visit. Copied from CRM [id number]. Topic: Clinical - Red Word Triage >> Jan 31, 2023  8:45 AM Larwance Sachs wrote: Red Word that prompted transfer to Nurse Triage: Patient called in regarding upper respiratory infection, states she was seen at urgent care and was not prescribed anything last week   Patient states having continuous Left ear pain- and pain/ sinus pressure around eyes plus headache, also stated after blowing nose room was kinda spinning. Patient is interested in getting a Zpac Reason for Disposition  Earache  Answer Assessment - Initial Assessment Questions 1. LOCATION: "Where does it hurt?"      Forehead, headache, under eyes.  2. ONSET: "When did the sinus pain start?"  (e.g., hours, days)      X 2 weeks  3. SEVERITY: "How bad is the pain?"   (Scale 1-10; mild, moderate or severe)   - MILD (1-3): doesn't interfere with normal activities    - MODERATE (4-7): interferes with normal activities (e.g., work or school) or awakens from sleep   - SEVERE (8-10): excruciating pain and patient unable to do any normal activities        8/10.  4. RECURRENT SYMPTOM: "Have you ever had sinus  problems before?" If Yes, ask: "When was the last time?" and "What happened that time?"      Yes; she states in the past she got a Zpack and it was sinus infection. She states it has been years since an infection.  5. NASAL CONGESTION: "Is the nose blocked?" If Yes, ask: "Can you open it or must you breathe through your mouth?"     She states she feels the congestion is starting to get to her chest and cause a cough.  6. NASAL DISCHARGE: "Do you have discharge from your nose?" If so ask, "What color?"     Yes. Yellow mucus.  7. FEVER: "Do you have a fever?" If Yes, ask: "What is it, how was it measured, and when did it start?"      Denies.  8. OTHER SYMPTOMS: "Do you have any other symptoms?" (e.g., sore throat, cough, earache, difficulty breathing)     Left ear "feels stopped up"; felt dizzy while blowing nose, cough/  Protocols used: Sinus Pain or Congestion-A-AH

## 2023-01-31 NOTE — Telephone Encounter (Signed)
Patient has been seen by dr. Caryl Never

## 2023-02-22 ENCOUNTER — Ambulatory Visit: Payer: 59 | Admitting: Family Medicine

## 2023-02-24 ENCOUNTER — Other Ambulatory Visit: Payer: Self-pay | Admitting: Family Medicine

## 2023-02-24 DIAGNOSIS — I10 Essential (primary) hypertension: Secondary | ICD-10-CM

## 2023-02-24 NOTE — Telephone Encounter (Signed)
 Patient would need to schedule a follow-up visit for Blood pressure from the patient OV with dry Gail Lloyd,

## 2023-02-27 ENCOUNTER — Ambulatory Visit (INDEPENDENT_AMBULATORY_CARE_PROVIDER_SITE_OTHER): Payer: 59 | Admitting: Family Medicine

## 2023-02-27 ENCOUNTER — Encounter: Payer: Self-pay | Admitting: Family Medicine

## 2023-02-27 VITALS — BP 100/68 | HR 94 | Temp 98.4°F | Ht 67.0 in | Wt 179.4 lb

## 2023-02-27 DIAGNOSIS — H6992 Unspecified Eustachian tube disorder, left ear: Secondary | ICD-10-CM

## 2023-02-27 DIAGNOSIS — R634 Abnormal weight loss: Secondary | ICD-10-CM | POA: Diagnosis not present

## 2023-02-27 DIAGNOSIS — I10 Essential (primary) hypertension: Secondary | ICD-10-CM

## 2023-02-27 DIAGNOSIS — E119 Type 2 diabetes mellitus without complications: Secondary | ICD-10-CM

## 2023-02-27 LAB — POCT GLYCOSYLATED HEMOGLOBIN (HGB A1C): Hemoglobin A1C: 6.5 % — AB (ref 4.0–5.6)

## 2023-02-27 MED ORDER — AMLODIPINE BESYLATE 5 MG PO TABS: ORAL_TABLET | ORAL | 3 refills | Status: DC

## 2023-02-27 MED ORDER — HYDROCHLOROTHIAZIDE 25 MG PO TABS: 25.0000 mg | ORAL_TABLET | Freq: Every morning | ORAL | 3 refills | Status: AC

## 2023-02-27 MED ORDER — LOSARTAN POTASSIUM 50 MG PO TABS: 50.0000 mg | ORAL_TABLET | Freq: Every day | ORAL | 3 refills | Status: AC

## 2023-02-27 MED ORDER — CARVEDILOL 6.25 MG PO TABS: 6.2500 mg | ORAL_TABLET | Freq: Two times a day (BID) | ORAL | 3 refills | Status: DC

## 2023-02-27 NOTE — Progress Notes (Signed)
 Established Patient Office Visit   Subjective  Patient ID: Gail Lloyd, female    DOB: 02/26/1965  Age: 58 y.o. MRN: [id number]  Chief Complaint  Patient presents with   Medical Management of Chronic Issues   Annual Exam    Pt is a 58 yo female seen for f/u and refills.  Pt wasn't sure if this appt was for CPE.  Mentions being concerned about bs. No recent A1C.  BS at home 100-140s with highest reading 160.  Pt has noticed some wt loss.  May eat one meal per day and snack the rest of the day.  Patient mentions left ear with intermittent muffled sound and popping since being sick with a viral illness that lasted 2 weeks.    Patient Active Problem List   Diagnosis Date Noted   Coronary artery calcification seen on CT scan 07/06/2022   Hyperlipidemia LDL goal <70 07/06/2022   Palpitations 07/06/2022   Colon polyp 02/17/2021   Diet-controlled diabetes mellitus (HCC) 09/14/2020   Nasal congestion 07/29/2020   Left knee pain 05/26/2020   Elevated lipoprotein(a) 04/09/2020   Global amnesia 12/13/2019   Prediabetes 10/10/2019   Essential hypertension 10/10/2019   Primary snoring 10/10/2019   Past Medical History:  Diagnosis Date   Coronary artery calcification seen on CT scan 07/06/2022   CAC score 03/03/2020: CAC score 7.46 (83rd percentile)   Heart murmur    Hyperlipidemia    on meds  and controlled   Hypertension    controlled on meds   Pre-diabetes    Prediabetes    no meds   Past Surgical History:  Procedure Laterality Date   COLON SURGERY     WISDOM TOOTH EXTRACTION     age 32   Social History   Tobacco Use   Smoking status: Never   Smokeless tobacco: Never  Vaping Use   Vaping status: Never Used  Substance Use Topics   Alcohol use: No   Drug use: No   Family History  Problem Relation Age of Onset   Diabetes Mother    Hypertension Mother    Hypertension Father    Cancer Father    Breast cancer Maternal Aunt 30   Colon cancer Neg Hx    Colon polyps Neg  Hx    Esophageal cancer Neg Hx    Rectal cancer Neg Hx    Stomach cancer Neg Hx    Allergies  Allergen Reactions   Penicillins Hives and Itching    Tolerated cephalosporin (02/17/21)   Sudafed [Pseudoephedrine Hcl] Palpitations      ROS Negative unless stated above    Objective:     BP 100/68 (BP Location: Left Arm, Patient Position: Sitting, Cuff Size: Large)   Pulse 94   Temp 98.4 F (36.9 C) (Oral)   Ht 5\' 7"  (1.702 m)   Wt 179 lb 6.4 oz (81.4 kg)   LMP 01/31/2017   SpO2 97%   BMI 28.10 kg/m  BP Readings from Last 3 Encounters:  02/27/23 100/68  01/24/23 (!) 91/56  11/29/22 120/64   Wt Readings from Last 3 Encounters:  02/27/23 179 lb 6.4 oz (81.4 kg)  11/29/22 188 lb 9.6 oz (85.5 kg)  07/06/22 187 lb 12.8 oz (85.2 kg)      Physical Exam Constitutional:      General: She is not in acute distress.    Appearance: Normal appearance.  HENT:     Head: Normocephalic and atraumatic.     Right Ear: Tympanic  membrane normal.     Ears:     Comments: L TM full.  No effusion noted.    Nose: Nose normal.     Mouth/Throat:     Mouth: Mucous membranes are moist.  Cardiovascular:     Rate and Rhythm: Normal rate and regular rhythm.     Heart sounds: Normal heart sounds. No murmur heard.    No gallop.  Pulmonary:     Effort: Pulmonary effort is normal. No respiratory distress.     Breath sounds: Normal breath sounds. No wheezing, rhonchi or rales.  Skin:    General: Skin is warm and dry.  Neurological:     Mental Status: She is alert and oriented to person, place, and time.     Diabetic Foot Exam - Simple   Simple Foot Form Diabetic Foot exam was performed with the following findings: Yes 02/27/2023  3:05 PM  Visual Inspection No deformities, no ulcerations, no other skin breakdown bilaterally: Yes Sensation Testing Intact to touch and monofilament testing bilaterally: Yes Pulse Check Posterior Tibialis and Dorsalis pulse intact bilaterally: Yes Comments      Results for orders placed or performed in visit on 02/27/23  POC HgB A1c  Result Value Ref Range   Hemoglobin A1C 6.5 (A) 4.0 - 5.6 %   HbA1c POC (<> result, manual entry)     HbA1c, POC (prediabetic range)     HbA1c, POC (controlled diabetic range)        Assessment & Plan:  Diet-controlled diabetes mellitus (HCC) -Hemoglobin A1c 6.5% this visit -Continue lifestyle modifications -Continue monitoring blood sugar at home.  For consistently elevated readings start medication. -Continue ARB.  Statin not indicated as cholesterol well-controlled.  Will recheck at upcoming office visit. -Foot exam done this visit -Patient to schedule eye exam -     Microalbumin / creatinine urine ratio -     POCT glycosylated hemoglobin (Hb A1C)  Essential hypertension -Well controlled -Patient encouraged to monitor BP at home.  For consistently low normal readings consider stopping Norvasc 5 mg daily. -Continue current medications including Norvasc 5 mg daily, Coreg 6.25 mg twice daily, HCTZ 25 mg daily, losartan 50 mg daily -Continue lifestyle modifications -     amLODIPine Besylate; TAKE 1 TABLET(5 MG) BY MOUTH DAILY  Dispense: 90 tablet; Refill: 3 -     Carvedilol; Take 1 tablet (6.25 mg total) by mouth 2 (two) times daily with a meal.  Dispense: 180 tablet; Refill: 3 -     hydroCHLOROthiazide; Take 1 tablet (25 mg total) by mouth in the morning.  Dispense: 90 tablet; Refill: 3 -     Losartan Potassium; Take 1 tablet (50 mg total) by mouth daily.  Dispense: 90 tablet; Refill: 3  Dysfunction of left eustachian tube -Likely caused by recent viral infection. -Discussed OTC antihistamines such as Allegra, Zyrtec, Claritin, Xyzal.  Can also consider Flonase or saline nasal rinse to help with symptoms.  Weight loss -Unintentional -Discussed the importance of eating regular meals throughout the day.-Continue to monitor   Return in about 3 months (around 05/27/2023) for physical.   Deeann Saint, MD

## 2023-02-27 NOTE — Patient Instructions (Addendum)
 Your hemoglobin A1c was 6.5% this visit.  This is still in a great range.  Ideally we like her A1c to be less than 7%.  You can schedule your physical in May or June 2025.  You should set up an eye exam to screen for diabetic retinopathy.  Do not forget to get a new blood pressure monitor.  Start checking your blood pressure at home.  For consistently low readings (less than 110/70) we can look at decreasing or getting rid of the amlodipine 5 mg.

## 2023-02-28 LAB — MICROALBUMIN / CREATININE URINE RATIO
Creatinine,U: 140.6 mg/dL
Microalb Creat Ratio: 5 mg/g (ref 0.0–30.0)
Microalb, Ur: <0.7 mg/dL (ref 0.0–1.9)

## 2023-03-27 ENCOUNTER — Other Ambulatory Visit: Payer: Self-pay | Admitting: Family Medicine

## 2023-03-27 DIAGNOSIS — I10 Essential (primary) hypertension: Secondary | ICD-10-CM

## 2023-05-12 ENCOUNTER — Other Ambulatory Visit: Payer: Self-pay | Admitting: Family Medicine

## 2023-05-12 DIAGNOSIS — I10 Essential (primary) hypertension: Secondary | ICD-10-CM

## 2023-06-02 ENCOUNTER — Encounter: Payer: 59 | Admitting: Family Medicine

## 2023-06-23 ENCOUNTER — Other Ambulatory Visit: Payer: Self-pay | Admitting: Family Medicine

## 2023-06-23 ENCOUNTER — Ambulatory Visit (INDEPENDENT_AMBULATORY_CARE_PROVIDER_SITE_OTHER): Admitting: Family Medicine

## 2023-06-23 ENCOUNTER — Encounter: Payer: Self-pay | Admitting: Family Medicine

## 2023-06-23 ENCOUNTER — Ambulatory Visit: Payer: Self-pay | Admitting: Family Medicine

## 2023-06-23 VITALS — BP 120/80 | HR 79 | Temp 97.9°F | Ht 67.0 in | Wt 183.0 lb

## 2023-06-23 DIAGNOSIS — I10 Essential (primary) hypertension: Secondary | ICD-10-CM

## 2023-06-23 DIAGNOSIS — H9312 Tinnitus, left ear: Secondary | ICD-10-CM

## 2023-06-23 DIAGNOSIS — Z0000 Encounter for general adult medical examination without abnormal findings: Secondary | ICD-10-CM

## 2023-06-23 DIAGNOSIS — E876 Hypokalemia: Secondary | ICD-10-CM

## 2023-06-23 DIAGNOSIS — R208 Other disturbances of skin sensation: Secondary | ICD-10-CM

## 2023-06-23 DIAGNOSIS — E538 Deficiency of other specified B group vitamins: Secondary | ICD-10-CM | POA: Diagnosis not present

## 2023-06-23 DIAGNOSIS — E119 Type 2 diabetes mellitus without complications: Secondary | ICD-10-CM

## 2023-06-23 DIAGNOSIS — E559 Vitamin D deficiency, unspecified: Secondary | ICD-10-CM

## 2023-06-23 LAB — COMPREHENSIVE METABOLIC PANEL WITH GFR
ALT: 13 U/L (ref 0–35)
AST: 12 U/L (ref 0–37)
Albumin: 4.1 g/dL (ref 3.5–5.2)
Alkaline Phosphatase: 54 U/L (ref 39–117)
BUN: 17 mg/dL (ref 6–23)
CO2: 32 meq/L (ref 19–32)
Calcium: 9.4 mg/dL (ref 8.4–10.5)
Chloride: 99 meq/L (ref 96–112)
Creatinine, Ser: 0.79 mg/dL (ref 0.40–1.20)
GFR: 82.44 mL/min (ref >60.00)
Glucose, Bld: 120 mg/dL — ABNORMAL HIGH (ref 70–99)
Potassium: 3.4 meq/L — ABNORMAL LOW (ref 3.5–5.1)
Sodium: 139 meq/L (ref 135–145)
Total Bilirubin: 0.7 mg/dL (ref 0.2–1.2)
Total Protein: 7.7 g/dL (ref 6.0–8.3)

## 2023-06-23 LAB — VITAMIN B12: Vitamin B-12: 264 pg/mL (ref 211–911)

## 2023-06-23 LAB — LIPID PANEL
Cholesterol: 121 mg/dL (ref 0–200)
HDL: 46 mg/dL (ref >39.00)
LDL Cholesterol: 57 mg/dL (ref 0–99)
NonHDL: 74.66
Total CHOL/HDL Ratio: 3
Triglycerides: 86 mg/dL (ref 0.0–149.0)
VLDL: 17.2 mg/dL (ref 0.0–40.0)

## 2023-06-23 LAB — CBC WITH DIFFERENTIAL/PLATELET
Basophils Absolute: 0 10*3/uL (ref 0.0–0.1)
Basophils Relative: 0.5 % (ref 0.0–3.0)
Eosinophils Absolute: 0.3 10*3/uL (ref 0.0–0.7)
Eosinophils Relative: 4.9 % (ref 0.0–5.0)
HCT: 36.8 % (ref 36.0–46.0)
Hemoglobin: 12.2 g/dL (ref 12.0–15.0)
Lymphocytes Relative: 31.4 % (ref 12.0–46.0)
Lymphs Abs: 1.6 10*3/uL (ref 0.7–4.0)
MCHC: 33.3 g/dL (ref 30.0–36.0)
MCV: 86.9 fl (ref 78.0–100.0)
Monocytes Absolute: 0.5 10*3/uL (ref 0.1–1.0)
Monocytes Relative: 9.9 % (ref 3.0–12.0)
Neutro Abs: 2.8 10*3/uL (ref 1.4–7.7)
Neutrophils Relative %: 53.3 % (ref 43.0–77.0)
Platelets: 293 10*3/uL (ref 150.0–400.0)
RBC: 4.23 Mil/uL (ref 3.87–5.11)
RDW: 13.8 % (ref 11.5–15.5)
WBC: 5.3 10*3/uL (ref 4.0–10.5)

## 2023-06-23 LAB — HEMOGLOBIN A1C: Hgb A1c MFr Bld: 7 % — ABNORMAL HIGH (ref 4.6–6.5)

## 2023-06-23 LAB — TSH: TSH: 0.78 u[IU]/mL (ref 0.35–5.50)

## 2023-06-23 LAB — FOLATE: Folate: 11.6 ng/mL (ref >5.9)

## 2023-06-23 LAB — VITAMIN D 25 HYDROXY (VIT D DEFICIENCY, FRACTURES): VITD: 14.6 ng/mL — ABNORMAL LOW (ref 30.00–100.00)

## 2023-06-23 LAB — T4, FREE: Free T4: 0.78 ng/dL (ref 0.60–1.60)

## 2023-06-23 MED ORDER — VITAMIN D (ERGOCALCIFEROL) 1.25 MG (50000 UNIT) PO CAPS
50000.0000 [IU] | ORAL_CAPSULE | ORAL | 0 refills | Status: AC
Start: 2023-06-23 — End: ?

## 2023-06-23 MED ORDER — POTASSIUM CHLORIDE CRYS ER 20 MEQ PO TBCR: 20.0000 meq | EXTENDED_RELEASE_TABLET | Freq: Every day | ORAL | 0 refills | Status: AC

## 2023-06-23 NOTE — Telephone Encounter (Signed)
-----   Message from Viola Greulich sent at 06/23/2023 12:31 PM EDT ----- Your vitamin D level came back low at 14.60 (nml>30).  For some people, low vitamin D can make you feel like you have no energy.  An rx for Ergocalciferol 50,000 IU was sent to your pharmacy.  You  are to take 1 pill per week for the next 12 weeks.  After completing the weekly dose you can then take OTC Vitamin D 1,000-2,000 IU daily.  We can then recheck your Vitamin D level.  Hemoglobin A1c now 7%.  Previously 6.5%.  Continue to work on diet changes.  For continued elevation in A1c consider medication options.  Potassium mildly low at 3.4.  Will send in a few days of potassium replacement to help improve this number.  Potassium supplement pills can be large in size.  They can be broken or dissolved in a  small amount of water.  Vitamin B12 low normal.  For some people this can cause fatigue.  Consider taking an over-the-counter vitamin B12 supplement 1000-2000 IUs daily.  Other labs normal.  ----- Message ----- From: Interface, [facility] Sent: 06/23/2023  11:57 AM EDT To: Viola Greulich, MD

## 2023-06-23 NOTE — Progress Notes (Signed)
 Established Patient Office Visit   Subjective  Patient ID: Gail Lloyd, female    DOB: 17-Dec-1965  Age: 58 y.o. MRN: [id number]  Chief Complaint  Patient presents with   Annual Exam    Physical; no concerns; will set appt with eye doctor for Sept. [facility] [place] [facility], pap a year ago    Patient is a 58 year old female seen for CPE and follow-up.  Patient states she is doing well overall.  Having several episodes of a warm feeling in right ear accompanied by tinnitus, and a headache in the center of head.  Episodes occurring every 2 weeks or so.  May last 2 minutes.  Tinnitus can also occur outside of the warm sensation and headache.  Warm sensation feels almost like a hot flash.  Denies changes in vision, nausea, vomiting, sensitivity to light or sound, diaphoresis.  Taking iron pills twice a week as they cause constipation and taken more frequently.  Patient with diet-controlled diabetes. Has eye exam scheduled for September.  This patient seen by OB/[facility]N [facility] [address] for Pap 1 year ago.    Patient Active Problem List   Diagnosis Date Noted   Coronary artery calcification seen on CT scan 07/06/2022   Hyperlipidemia LDL goal <70 07/06/2022   Palpitations 07/06/2022   Colon polyp 02/17/2021   Diet-controlled diabetes mellitus (HCC) 09/14/2020   Nasal congestion 07/29/2020   Left knee pain 05/26/2020   Elevated lipoprotein(a) 04/09/2020   Global amnesia 12/13/2019   Prediabetes 10/10/2019   Essential hypertension 10/10/2019   Primary snoring 10/10/2019   Past Medical History:  Diagnosis Date   Coronary artery calcification seen on CT scan 07/06/2022   CAC score 03/03/2020: CAC score 7.46 (83rd percentile)   Heart murmur    Hyperlipidemia    on meds  and controlled   Hypertension    controlled on meds   Pre-diabetes    Prediabetes    no meds   Past Surgical History:  Procedure Laterality Date   COLON SURGERY     WISDOM TOOTH EXTRACTION     age 5   Social History    Tobacco Use   Smoking status: Never   Smokeless tobacco: Never  Vaping Use   Vaping status: Never Used  Substance Use Topics   Alcohol use: No   Drug use: No   Family History  Problem Relation Age of Onset   Diabetes Mother    Hypertension Mother    Hypertension Father    Cancer Father    Breast cancer Maternal Aunt 30   Colon cancer Neg Hx    Colon polyps Neg Hx    Esophageal cancer Neg Hx    Rectal cancer Neg Hx    Stomach cancer Neg Hx    Allergies  Allergen Reactions   Penicillins Hives and Itching    Tolerated cephalosporin (02/17/21)   Sudafed [Pseudoephedrine Hcl] Palpitations    ROS Negative unless stated above    Objective:     BP 120/80   Pulse 79   Temp 97.9 F (36.6 C)   Ht 5' 7 (1.702 m)   Wt 183 lb (83 kg)   LMP 01/31/2017   SpO2 97%   BMI 28.66 kg/m  BP Readings from Last 3 Encounters:  06/23/23 120/80  02/27/23 100/68  01/24/23 (!) 91/56   Wt Readings from Last 3 Encounters:  06/23/23 183 lb (83 kg)  02/27/23 179 lb 6.4 oz (81.4 kg)  11/29/22 188 lb 9.6 oz (85.5 kg)  Physical Exam Constitutional:      Appearance: Normal appearance.  HENT:     Head: Normocephalic and atraumatic.     Right Ear: Tympanic membrane, ear canal and external ear normal.     Left Ear: Tympanic membrane, ear canal and external ear normal.     Nose: Nose normal.     Mouth/Throat:     Mouth: Mucous membranes are moist.     Pharynx: No oropharyngeal exudate or posterior oropharyngeal erythema.   Eyes:     General: Lids are normal. No scleral icterus.    Extraocular Movements: Extraocular movements intact.     Right eye: No nystagmus.     Left eye: No nystagmus.     Conjunctiva/sclera: Conjunctivae normal.     Pupils: Pupils are equal, round, and reactive to light.   Neck:     Thyroid: No thyromegaly.   Cardiovascular:     Rate and Rhythm: Normal rate and regular rhythm.     Pulses: Normal pulses.     Heart sounds: Normal heart sounds. No  murmur heard.    No friction rub.  Pulmonary:     Effort: Pulmonary effort is normal.     Breath sounds: Normal breath sounds. No wheezing, rhonchi or rales.  Abdominal:     General: Bowel sounds are normal.     Palpations: Abdomen is soft.     Tenderness: There is no abdominal tenderness.   Musculoskeletal:        General: No deformity. Normal range of motion.  Lymphadenopathy:     Cervical: No cervical adenopathy.   Skin:    General: Skin is warm and dry.     Findings: No lesion.   Neurological:     General: No focal deficit present.     Mental Status: She is alert and oriented to person, place, and time.   Psychiatric:        Mood and Affect: Mood normal.        Thought Content: Thought content normal.        02/27/2023    3:10 PM 01/31/2023    9:12 AM 11/10/2021    3:04 PM  Depression screen PHQ 2/9  Decreased Interest 0 0 0  Down, Depressed, Hopeless 0 0 0  PHQ - 2 Score 0 0 0  Altered sleeping 2  3  Tired, decreased energy 0  0  Change in appetite   0  Feeling bad or failure about yourself  0  0  Trouble concentrating 0  0  Moving slowly or fidgety/restless 0  0  Suicidal thoughts 0  0  PHQ-9 Score 2  3  Difficult doing work/chores Not difficult at all  Somewhat difficult      02/27/2023    3:10 PM 01/31/2023    9:12 AM 02/10/2020    1:52 PM 01/01/2020   10:58 AM  GAD 7 : Generalized Anxiety Score  Nervous, Anxious, on Edge 0 0 1 3  Control/stop worrying 0 0 0 2  Worry too much - different things 0 0 1 3  Trouble relaxing 0 0 0 2  Restless 0 3 0 1  Easily annoyed or irritable 0 0 0 1  Afraid - awful might happen 0 0 1 3  Total GAD 7 Score 0 3 3 15   Anxiety Difficulty Not difficult at all  Somewhat difficult Somewhat difficult     No results found for any visits on 06/23/23.    Assessment & Plan:  Well adult exam -     CBC with Differential/Platelet; Future -     Comprehensive metabolic panel with GFR; Future -     Hemoglobin A1c; Future -      Lipid panel; Future -     T4, free; Future -     TSH; Future  Diet-controlled diabetes mellitus (HCC) -     Comprehensive metabolic panel with GFR; Future -     Hemoglobin A1c; Future  Essential hypertension -     Lipid panel; Future -     T4, free; Future -     TSH; Future  B12 deficiency -     CBC with Differential/Platelet; Future -     Vitamin B12; Future  Tinnitus of left ear -     CBC with Differential/Platelet; Future -     Comprehensive metabolic panel with GFR; Future -     T4, free; Future -     TSH; Future -     VITAMIN D 25 Hydroxy (Vit-D Deficiency, Fractures); Future -     Folate; Future -     Iron, TIBC and Ferritin Panel; Future  Sensation of being warm -     CBC with Differential/Platelet; Future -     TSH; Future -     Vitamin B12; Future -     VITAMIN D 25 Hydroxy (Vit-D Deficiency, Fractures); Future -     Iron, TIBC and Ferritin Panel; Future  Age-appropriate health screenings discussed.  Obtain labs.  Immunizations reviewed.  Mammogram done 06/13/2022.  Colonoscopy done 01/14/2021.  Pap done 2024 with OB/GYN.  Continue lifestyle modifications for DM diet controlled.  Eye exam scheduled for September.  Foot exam done 02/23/2023.  Recheck A1c this visit.  BP well-controlled on Norvasc  5 mg daily, Coreg  6.25 mg twice daily, HCTZ 12.5 mg daily, losartan  50 mg daily.  Obtain labs to further evaluate sensation of warmth in left ear with tinnitus leading to headache.  Referral to ENT also placed.  Return in about 1 year (around 06/22/2024), or if symptoms worsen or fail to improve, for physical.   Viola Greulich, MD

## 2023-06-24 LAB — IRON,TIBC AND FERRITIN PANEL
%SAT: 26 % (ref 16–45)
Ferritin: 107 ng/mL (ref 16–232)
Iron: 75 ug/dL (ref 45–160)
TIBC: 292 ug/dL (ref 250–450)

## 2023-07-11 ENCOUNTER — Other Ambulatory Visit: Payer: Self-pay | Admitting: Physician Assistant

## 2023-07-25 ENCOUNTER — Other Ambulatory Visit: Payer: Self-pay | Admitting: Family Medicine

## 2023-07-25 DIAGNOSIS — E876 Hypokalemia: Secondary | ICD-10-CM

## 2023-09-10 ENCOUNTER — Other Ambulatory Visit: Payer: Self-pay | Admitting: Family Medicine

## 2023-09-10 DIAGNOSIS — E559 Vitamin D deficiency, unspecified: Secondary | ICD-10-CM

## 2023-09-25 ENCOUNTER — Other Ambulatory Visit: Payer: Self-pay | Admitting: Family Medicine

## 2023-09-25 DIAGNOSIS — Z1231 Encounter for screening mammogram for malignant neoplasm of breast: Secondary | ICD-10-CM

## 2023-10-02 ENCOUNTER — Ambulatory Visit
Admission: RE | Admit: 2023-10-02 | Discharge: 2023-10-02 | Disposition: A | Source: Ambulatory Visit | Attending: Family Medicine

## 2023-10-02 DIAGNOSIS — Z1231 Encounter for screening mammogram for malignant neoplasm of breast: Secondary | ICD-10-CM | POA: Diagnosis not present

## 2023-10-11 ENCOUNTER — Ambulatory Visit (INDEPENDENT_AMBULATORY_CARE_PROVIDER_SITE_OTHER)

## 2023-10-11 VITALS — BP 122/84 | Ht 67.0 in | Wt 182.0 lb

## 2023-10-11 DIAGNOSIS — M545 Low back pain, unspecified: Secondary | ICD-10-CM | POA: Diagnosis not present

## 2023-10-11 DIAGNOSIS — M25552 Pain in left hip: Secondary | ICD-10-CM

## 2023-10-11 MED ORDER — CYCLOBENZAPRINE HCL 10 MG PO TABS: 10.0000 mg | ORAL_TABLET | Freq: Every evening | ORAL | 0 refills | Status: AC | PRN

## 2023-10-11 NOTE — Patient Instructions (Addendum)
 [facility] - [place] [phone]

## 2023-10-11 NOTE — Progress Notes (Signed)
 PCP: Mercer Clotilda SAUNDERS, MD  Subjective:   HPI: Patient is a 58 y.o. female here for low back pain.  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  She has  been on this disposition for about 3 weeks.  She states about a week and a half ago, she noticed increased back pain that has slowly been progressing and worsening.  There are many times throughout the day that she has to pick up her child can be up to 30 pounds.  She states that the pain is worse on her left side than the right.  She denies any radiating tingling or numbness.  She denies any motor weakness.  She denies any previous surgeries, loss of bowel or bladder function or groin numbness.  Past Medical History:  Diagnosis Date   Coronary artery calcification seen on CT scan 07/06/2022   CAC score 03/03/2020: CAC score 7.46 (83rd percentile)   Heart murmur    Hyperlipidemia    on meds  and controlled   Hypertension    controlled on meds   Pre-diabetes    Prediabetes    no meds    Current Outpatient Medications on File Prior to Visit  Medication Sig Dispense Refill   amLODipine  (NORVASC ) 5 MG tablet TAKE 1 TABLET(5 MG) BY MOUTH DAILY 90 tablet 3   ascorbic acid (VITAMIN C) 1000 MG tablet Take 500 mg by mouth every 30 (thirty) days.     carvedilol  (COREG ) 6.25 MG tablet Take 1 tablet (6.25 mg total) by mouth 2 (two) times daily with a meal. 180 tablet 3   diphenhydrAMINE  (BENADRYL ) 25 MG tablet Take 25 mg by mouth every 6 (six) hours as needed for allergies.     fluticasone  (FLONASE ) 50 MCG/ACT nasal spray Place 2 sprays into both nostrils daily. 15.8 g 0   hydrochlorothiazide  (HYDRODIURIL ) 25 MG tablet Take 1 tablet (25 mg total) by mouth in the morning. 90 tablet 3   ibuprofen  (ADVIL ) 800 MG tablet Take 1 tablet (800 mg total) by mouth every 8 (eight) hours as needed (pain). 21 tablet 0   losartan  (COZAAR ) 50 MG tablet Take 1 tablet (50 mg total) by mouth daily. 90 tablet 3   Multiple  Vitamin (MULTIVITAMIN WITH MINERALS) TABS tablet Take 1 tablet by mouth daily.     Multiple Vitamins-Minerals (EMERGEN-C IMMUNE PLUS/VIT D) CHEW Chew 1 each by mouth daily.     potassium chloride  SA (KLOR-CON  M) 20 MEQ tablet Take 1 tablet (20 mEq total) by mouth daily for 4 days. 4 tablet 0   rosuvastatin  (CRESTOR ) 10 MG tablet TAKE 1 TABLET(10 MG) BY MOUTH DAILY 90 tablet 3   Vitamin D , Ergocalciferol , (DRISDOL ) 1.25 MG (50000 UNIT) CAPS capsule Take 1 capsule (50,000 Units total) by mouth every 7 (seven) days. 12 capsule 0   No current facility-administered medications on file prior to visit.    BP 122/84   Ht 5' 7 (1.702 m)   Wt 182 lb (82.6 kg)   LMP 01/31/2017   BMI 28.51 kg/m        Objective:   Physical Exam:  Gen: NAD, comfortable in exam room Lumbar spine and left hip Palpation: Tenderness palpation over the lumbar's paraspinal muscle regions bilaterally, left worse than right, some midline spinous process tenderness, some tenderness over the gluteal muscles ROM: Full flexion, internal and external rotation of the left hip without pain Special Tests: Negative straight leg test bilaterally, negative Stinchfield, negative logroll, Trendelenburg  positive on the left Neuro: Sensation is intact to the bilateral lower extremities, strength is 5/5 in the extension, knee flexion, plantarflexion, dorsiflexion.  Patient has weakness with hip abduction on the left worse on the right  Assessment/Plan:   Gail Lloyd is a 58 y.o. female who was seen today for the following: 1. Acute left-sided low back pain without sciatica (Primary) 2. Pain of left hip - Patient exam is reassuring that there is no neurological finding - Likely low back strain with left hip weakness/strain - Will send in patient muscle relaxer as well as formal physical therapy - Patient can be off work until Monday - She should take the muscle relaxer at night continue Advil  and Tylenol  as needed - Heat pad is  also okay  - cyclobenzaprine (FLEXERIL) 10 MG tablet; Take 1 tablet (10 mg total) by mouth at bedtime as needed for muscle spasms.  Dispense: 15 tablet; Refill: 0 - Ambulatory referral to Physical Therapy - Follow-up in 4 weeks  Follow-up/Education:   No follow-ups on file.   May return sooner as needed and encouraged to call/e-mail for additional questions or  worsening symptoms in the interim.  Krystal Lowing, DO Sports Medicine Fellow 10/11/2023 12:24 PM

## 2023-11-13 ENCOUNTER — Ambulatory Visit: Admitting: Family Medicine

## 2023-11-29 ENCOUNTER — Ambulatory Visit (INDEPENDENT_AMBULATORY_CARE_PROVIDER_SITE_OTHER)

## 2023-11-29 DIAGNOSIS — Z23 Encounter for immunization: Secondary | ICD-10-CM | POA: Diagnosis not present

## 2024-02-07 ENCOUNTER — Other Ambulatory Visit: Payer: Self-pay | Admitting: Family Medicine

## 2024-02-07 DIAGNOSIS — I10 Essential (primary) hypertension: Secondary | ICD-10-CM
